# Patient Record
Sex: Female | Born: 2000 | Hispanic: No | Marital: Single | Smoking: Former smoker
Health system: Southern US, Community
[De-identification: ages and names within clinical notes are randomized; demographics above are authoritative.]

## PROBLEM LIST (undated history)

## (undated) DIAGNOSIS — N739 Female pelvic inflammatory disease, unspecified: Secondary | ICD-10-CM

## (undated) DIAGNOSIS — R569 Unspecified convulsions: Secondary | ICD-10-CM

## (undated) DIAGNOSIS — D649 Anemia, unspecified: Secondary | ICD-10-CM

## (undated) HISTORY — PX: MYRINGOTOMY: SHX2060

## (undated) HISTORY — PX: TONSILLECTOMY: SUR1361

## (undated) HISTORY — PX: ROOT CANAL: SHX2363

## (undated) HISTORY — PX: TEAR DUCT PROBING: SHX793

---
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
## 2018-03-31 ENCOUNTER — Emergency Department (HOSPITAL_COMMUNITY): Payer: Self-pay

## 2018-03-31 ENCOUNTER — Other Ambulatory Visit: Payer: Self-pay

## 2018-03-31 ENCOUNTER — Emergency Department (HOSPITAL_COMMUNITY)
Admission: EM | Admit: 2018-03-31 | Discharge: 2018-03-31 | Disposition: A | Discharge status: Home / Self Care | Payer: Self-pay | Attending: Emergency Medicine | Admitting: Emergency Medicine

## 2018-03-31 ENCOUNTER — Encounter (HOSPITAL_COMMUNITY): Payer: Self-pay | Admitting: Emergency Medicine

## 2018-03-31 DIAGNOSIS — R569 Unspecified convulsions: Secondary | ICD-10-CM | POA: Insufficient documentation

## 2018-03-31 DIAGNOSIS — N72 Inflammatory disease of cervix uteri: Secondary | ICD-10-CM | POA: Insufficient documentation

## 2018-03-31 DIAGNOSIS — J45909 Unspecified asthma, uncomplicated: Secondary | ICD-10-CM | POA: Insufficient documentation

## 2018-03-31 DIAGNOSIS — R197 Diarrhea, unspecified: Secondary | ICD-10-CM | POA: Insufficient documentation

## 2018-03-31 DIAGNOSIS — N3000 Acute cystitis without hematuria: Secondary | ICD-10-CM | POA: Insufficient documentation

## 2018-03-31 DIAGNOSIS — R112 Nausea with vomiting, unspecified: Secondary | ICD-10-CM

## 2018-03-31 DIAGNOSIS — Z79899 Other long term (current) drug therapy: Secondary | ICD-10-CM | POA: Insufficient documentation

## 2018-03-31 DIAGNOSIS — Z87891 Personal history of nicotine dependence: Secondary | ICD-10-CM | POA: Insufficient documentation

## 2018-03-31 LAB — URINALYSIS, ROUTINE W REFLEX MICROSCOPIC
Bilirubin Urine: NEGATIVE
Glucose, UA: NEGATIVE mg/dL
Ketones, ur: 20 mg/dL — AB
Nitrite: NEGATIVE
PROTEIN: 30 mg/dL — AB
RBC / HPF: >50 RBC/hpf — ABNORMAL HIGH (ref 0–5)
Specific Gravity, Urine: 1.018 (ref 1.005–1.030)
pH: 6 (ref 5.0–8.0)

## 2018-03-31 LAB — COMPREHENSIVE METABOLIC PANEL
ALK PHOS: 57 U/L (ref 38–126)
ALT: 13 U/L (ref 0–44)
ANION GAP: 8 (ref 5–15)
AST: 18 U/L (ref 15–41)
Albumin: 4.7 g/dL (ref 3.5–5.0)
BUN: 8 mg/dL (ref 6–20)
CO2: 25 mmol/L (ref 22–32)
CREATININE: 0.92 mg/dL (ref 0.44–1.00)
Calcium: 9 mg/dL (ref 8.9–10.3)
Chloride: 106 mmol/L (ref 98–111)
GFR calc non Af Amer: >60 mL/min (ref >60)
Glucose, Bld: 88 mg/dL (ref 70–99)
Potassium: 3.5 mmol/L (ref 3.5–5.1)
Sodium: 139 mmol/L (ref 135–145)
Total Bilirubin: 1.1 mg/dL (ref 0.3–1.2)
Total Protein: 7.4 g/dL (ref 6.5–8.1)

## 2018-03-31 LAB — RAPID URINE DRUG SCREEN, HOSP PERFORMED
Amphetamines: NOT DETECTED
Barbiturates: NOT DETECTED
Benzodiazepines: NOT DETECTED
Cocaine: POSITIVE — AB
OPIATES: NOT DETECTED
Tetrahydrocannabinol: POSITIVE — AB

## 2018-03-31 LAB — LIPASE, BLOOD: Lipase: 19 U/L (ref 11–51)

## 2018-03-31 LAB — CBC WITH DIFFERENTIAL/PLATELET
ABS IMMATURE GRANULOCYTES: 0.04 10*3/uL (ref 0.00–0.07)
Basophils Absolute: 0.1 10*3/uL (ref 0.0–0.1)
Basophils Relative: 1 %
EOS PCT: 1 %
Eosinophils Absolute: 0.2 10*3/uL (ref 0.0–0.5)
HEMATOCRIT: 44.6 % (ref 36.0–46.0)
HEMOGLOBIN: 14.5 g/dL (ref 12.0–15.0)
Immature Granulocytes: 0 %
LYMPHS PCT: 12 %
Lymphs Abs: 2.2 10*3/uL (ref 0.7–4.0)
MCH: 32.3 pg (ref 26.0–34.0)
MCHC: 32.5 g/dL (ref 30.0–36.0)
MCV: 99.3 fL (ref 80.0–100.0)
MONO ABS: 1 10*3/uL (ref 0.1–1.0)
MONOS PCT: 5 %
NEUTROS ABS: 15.2 10*3/uL — AB (ref 1.7–7.7)
Neutrophils Relative %: 81 %
Platelets: 277 10*3/uL (ref 150–400)
RBC: 4.49 MIL/uL (ref 3.87–5.11)
RDW: 11.9 % (ref 11.5–15.5)
WBC: 18.7 10*3/uL — AB (ref 4.0–10.5)
nRBC: 0 % (ref 0.0–0.2)

## 2018-03-31 LAB — WET PREP, GENITAL
Clue Cells Wet Prep HPF POC: NONE SEEN
Sperm: NONE SEEN
Trich, Wet Prep: NONE SEEN
Yeast Wet Prep HPF POC: NONE SEEN

## 2018-03-31 LAB — I-STAT BETA HCG BLOOD, ED (MC, WL, AP ONLY): I-stat hCG, quantitative: <5 m[IU]/mL (ref <5)

## 2018-03-31 MED ORDER — SULFAMETHOXAZOLE-TRIMETHOPRIM 800-160 MG PO TABS: 1.0000 | ORAL_TABLET | Freq: Two times a day (BID) | ORAL | 0 refills | Status: AC

## 2018-03-31 MED ORDER — DICYCLOMINE HCL 20 MG PO TABS: 20.0000 mg | ORAL_TABLET | Freq: Two times a day (BID) | ORAL | 0 refills | Status: DC | PRN

## 2018-03-31 MED ORDER — ONDANSETRON HCL 4 MG PO TABS: 4.0000 mg | ORAL_TABLET | Freq: Three times a day (TID) | ORAL | 0 refills | Status: DC | PRN

## 2018-03-31 MED ORDER — MORPHINE SULFATE (PF) 4 MG/ML IV SOLN
4.0000 mg | Freq: Once | INTRAVENOUS | Status: AC
  Administered 2018-03-31: 4 mg via INTRAVENOUS
  Filled 2018-03-31: qty 1

## 2018-03-31 MED ORDER — SODIUM CHLORIDE 0.9 % IV BOLUS
1000.0000 mL | Freq: Once | INTRAVENOUS | Status: AC
  Administered 2018-03-31: 1000 mL via INTRAVENOUS

## 2018-03-31 MED ORDER — SODIUM CHLORIDE 0.9 % IV SOLN
1.0000 g | Freq: Once | INTRAVENOUS | Status: AC
  Administered 2018-03-31: 1 g via INTRAVENOUS
  Filled 2018-03-31: qty 10

## 2018-03-31 MED ORDER — DOXYCYCLINE HYCLATE 100 MG PO CAPS: 100.0000 mg | ORAL_CAPSULE | Freq: Two times a day (BID) | ORAL | 0 refills | Status: AC

## 2018-03-31 MED ORDER — DICYCLOMINE HCL 10 MG PO CAPS
10.0000 mg | ORAL_CAPSULE | Freq: Once | ORAL | Status: AC
  Administered 2018-03-31: 10 mg via ORAL
  Filled 2018-03-31: qty 1

## 2018-03-31 MED ORDER — METOCLOPRAMIDE HCL 5 MG/ML IJ SOLN
5.0000 mg | Freq: Once | INTRAMUSCULAR | Status: AC
  Administered 2018-03-31: 5 mg via INTRAVENOUS
  Filled 2018-03-31: qty 2

## 2018-03-31 NOTE — ED Notes (Signed)
Pt returned from ultrasound

## 2018-03-31 NOTE — Discharge Instructions (Signed)
Take antibiotics as prescribed.  Take the entire course, even if your symptoms improve.  There are 2 different antibiotics, and the are both important for treating different things. Use Zofran as needed for nausea or vomiting. Use Bentyl as needed for abdominal pain. Stop using cocaine and marijuana, this is not helping your symptoms. Make sure you are staying well-hydrated water. If your symptoms not improving, follow-up with either your primary care doctor or your OB/GYN for further evaluation. Return to the emergency room if you develop high fevers, persistent vomiting, severe worsening abdominal pain, any new, worsening, or concerning symptoms.

## 2018-03-31 NOTE — ED Triage Notes (Signed)
N/V/D for four days. Denies fever. No cough Started period today, denies chance of pregnancy. 4mg  of zofran with EMS  Patient was explained risks of coming to ED, with possibility of getting COVID patient still wanted to come to ED

## 2018-03-31 NOTE — ED Provider Notes (Signed)
[facility]-EMERGENCY DEPT Provider Note   CSN: [id number] Arrival date & time: 03/31/18  1451    History   Chief Complaint Chief Complaint  Patient presents with   Nausea   Emesis   Diarrhea    HPI Hannah Drake is a 18 y.o. female presenting for evaluation nausea, vomiting, diarrhea, abdominal pain.  Patient states her symptoms began 4 days ago.  Began with nausea, vomiting, diarrhea.  A day or 2 after her symptoms began, she started to develop generalized abdominal pain.  Is constant.  Nothing makes it better or worse.  Yesterday, she started her period.  Patient states this does not normally occur when she has her period, although per chart review this is happened at least once before around her period.  She denies fevers, chills, chest pain, cough, shortness of breath, or abnormal urination.  She has not taken anything for her symptoms including Tylenol or ibuprofen.  Patient report symptoms are worse when she tries to eat, although she is vomiting without attempting p.o.  She denies hematemesis or blood in her stool.  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  Patient states she has no medical problems, takes medications daily.  She denies recent travel or sick contacts.  She denies history of abdominal surgeries.  Patient reports mild vaginal discharge which began several days before her symptoms.  She is sexually active with one female partner who is symptom-free.  She does not always use condoms, is not on birth control.     HPI  Past Medical History:  Diagnosis Date   Asthma    Seizures (HCC)    last seizure at age 37    There are no active problems to display for this patient.   Past Surgical History:  Procedure Laterality Date   MYRINGOTOMY     ROOT CANAL     TEAR DUCT PROBING     TONSILLECTOMY       OB History    Gravida  0   Para  0   Term  0   Preterm  0   AB  0   Living  0     SAB  0   TAB  0   Ectopic  0   Multiple  0   Live Births  0            Home Medications    Prior to Admission medications   Medication Sig Start Date End Date Taking? Authorizing Provider  acetaminophen (TYLENOL) 325 MG tablet Take 650 mg by mouth every 6 (six) hours as needed for mild pain, moderate pain, fever or headache.    Yes [provider]  ibuprofen (ADVIL,MOTRIN) 200 MG tablet Take 200 mg by mouth every 6 (six) hours as needed for moderate pain or cramping.   Yes [provider]  dicyclomine (BENTYL) 20 MG tablet Take 1 tablet (20 mg total) by mouth 2 (two) times daily as needed for spasms. 03/31/18   Suhan Paci, PA-C  doxycycline (VIBRAMYCIN) 100 MG capsule Take 1 capsule (100 mg total) by mouth 2 (two) times daily for 14 days. 03/31/18 04/14/18  Yukio Bisping, PA-C  fluticasone (FLONASE) 50 MCG/ACT nasal spray Place 2 sprays into both nostrils daily. Patient not taking: Reported on 10/14/2017 03/17/15   Ward, Chase Picket, PA-C  ondansetron (ZOFRAN) 4 MG tablet Take 1 tablet (4 mg total) by mouth every 8 (eight) hours as needed. 03/31/18   Jenniffer Vessels,  Adalid Beckmann, PA-C  sulfamethoxazole-trimethoprim (BACTRIM DS,SEPTRA DS) 800-160 MG tablet Take 1 tablet by mouth 2 (two) times daily for 3 days. 03/31/18 04/03/18  Stesha Neyens, Jeanette Caprice, PA-C    Family History Family History  Problem Relation Age of Onset   Cancer Mother    Luiz Blare' disease Mother    Seizures Mother    Asthma Mother     Social History Social History   Tobacco Use   Smoking status: Former Smoker   Smokeless tobacco: Never Used  Substance Use Topics   Alcohol use: No   Drug use: No     Allergies   Patient has no known allergies.   Review of Systems Review of Systems  Gastrointestinal: Positive for abdominal pain, diarrhea, nausea and vomiting.  Genitourinary: Positive for vaginal discharge.  All other systems reviewed and are negative.    Physical Exam Updated Vital  Signs BP 128/69    Pulse 61    Temp 98.6 F (37 C) (Oral)    Resp 18    Ht  (1.575 m)    Wt 56.7 kg    LMP 03/30/2018    SpO2 100%    BMI 22.86 kg/m   Physical Exam Vitals signs and nursing note reviewed. Exam conducted with a chaperone present.  Constitutional:      General: She is not in acute distress.    Appearance: She is well-developed.     Comments: Appears nontoxic  HENT:     Head: Normocephalic and atraumatic.     Mouth/Throat:     Mouth: Mucous membranes are moist.  Eyes:     Conjunctiva/sclera: Conjunctivae normal.     Pupils: Pupils are equal, round, and reactive to light.  Neck:     Musculoskeletal: Normal range of motion and neck supple.  Cardiovascular:     Rate and Rhythm: Normal rate and regular rhythm.     Pulses: Normal pulses.  Pulmonary:     Effort: Pulmonary effort is normal. No respiratory distress.     Breath sounds: Normal breath sounds. No wheezing.  Abdominal:     General: There is no distension.     Palpations: Abdomen is soft. There is no mass.     Tenderness: There is abdominal tenderness. There is no guarding or rebound.     Comments: Generalized abdominal tenderness.  No rigidity, guarding, distention.  Negative rebound.  Genitourinary:    Exam position: Supine.     Cervix: Cervical motion tenderness and cervical bleeding present.     Adnexa:        Right: No fullness.         Left: No fullness.       Comments: Minimal bleeding noted, from the cervix.  Patient reports extreme pain during pelvic exam.  Tender with palpation of the cervix.  No obvious masses or adnexal fullness. Musculoskeletal: Normal range of motion.  Skin:    General: Skin is warm and dry.     Capillary Refill: Capillary refill takes less than 2 seconds.  Neurological:     Mental Status: She is alert and oriented to person, place, and time.      ED Treatments / Results  Labs (all labs ordered are listed, but only abnormal results are displayed) Labs Reviewed    WET PREP, GENITAL - Abnormal; Notable for the following components:      Result Value   WBC, Wet Prep HPF POC FEW (*)    All other components within normal limits  CBC WITH  DIFFERENTIAL/PLATELET - Abnormal; Notable for the following components:   WBC 18.7 (*)    Neutro Abs 15.2 (*)    All other components within normal limits  URINALYSIS, ROUTINE W REFLEX MICROSCOPIC - Abnormal; Notable for the following components:   APPearance HAZY (*)    Hgb urine dipstick LARGE (*)    Ketones, ur 20 (*)    Protein, ur 30 (*)    Leukocytes,Ua SMALL (*)    RBC / HPF >50 (*)    Bacteria, UA MANY (*)    All other components within normal limits  RAPID URINE DRUG SCREEN, HOSP PERFORMED - Abnormal; Notable for the following components:   Cocaine POSITIVE (*)    Tetrahydrocannabinol POSITIVE (*)    All other components within normal limits  URINE CULTURE  COMPREHENSIVE METABOLIC PANEL  LIPASE, BLOOD  RPR  HIV ANTIBODY (ROUTINE TESTING W REFLEX)  I-STAT BETA HCG BLOOD, ED ([facility], [facility], [facility] ONLY)  GC/CHLAMYDIA PROBE AMP ([place]) NOT AT [facility]    EKG None  Radiology US Transvaginal Non-ob  Result Date: 03/31/2018 CLINICAL DATA:  Pain.  Rule out torsion. EXAM: TRANSABDOMINAL AND TRANSVAGINAL ULTRASOUND OF PELVIS DOPPLER ULTRASOUND OF OVARIES TECHNIQUE: Both transabdominal and transvaginal ultrasound examinations of the pelvis were performed. Transabdominal technique was performed for global imaging of the pelvis including uterus, ovaries, adnexal regions, and pelvic cul-de-sac. It was necessary to proceed with endovaginal exam following the transabdominal exam to visualize the endometrium and ovaries. Color and duplex Doppler ultrasound was utilized to evaluate blood flow to the ovaries. COMPARISON:  None. FINDINGS: Uterus Measurements: 8.2 x 3.5 x 3.9 cm = volume: 57.55 mL. No fibroids or other mass visualized. Endometrium Thickness: 2.8 mm.  No focal abnormality visualized. Right ovary Measurements: 3.0  x 2.2 x 2.9 cm = volume: 9.87 mL. Normal appearance/no adnexal mass. Left ovary Measurements: 3.1 x 1.6 x 2.8 cm = volume: 7.2 mL. Normal appearance/no adnexal mass. Pulsed Doppler evaluation of both ovaries demonstrates normal low-resistance arterial and venous waveforms. Other findings A small amount of fluid in the cul-de-sac is likely physiologic. IMPRESSION: 1. The uterus, endometrium, and ovaries are normal in appearance. No evidence of ovarian torsion. A small amount of fluid in the cul-de-sac is likely physiologic. Electronically Signed   By: Gerome Sam III M.D   On: 03/31/2018 19:25   US Pelvis Complete  Result Date: 03/31/2018 CLINICAL DATA:  Pain.  Rule out torsion. EXAM: TRANSABDOMINAL AND TRANSVAGINAL ULTRASOUND OF PELVIS DOPPLER ULTRASOUND OF OVARIES TECHNIQUE: Both transabdominal and transvaginal ultrasound examinations of the pelvis were performed. Transabdominal technique was performed for global imaging of the pelvis including uterus, ovaries, adnexal regions, and pelvic cul-de-sac. It was necessary to proceed with endovaginal exam following the transabdominal exam to visualize the endometrium and ovaries. Color and duplex Doppler ultrasound was utilized to evaluate blood flow to the ovaries. COMPARISON:  None. FINDINGS: Uterus Measurements: 8.2 x 3.5 x 3.9 cm = volume: 57.55 mL. No fibroids or other mass visualized. Endometrium Thickness: 2.8 mm.  No focal abnormality visualized. Right ovary Measurements: 3.0 x 2.2 x 2.9 cm = volume: 9.87 mL. Normal appearance/no adnexal mass. Left ovary Measurements: 3.1 x 1.6 x 2.8 cm = volume: 7.2 mL. Normal appearance/no adnexal mass. Pulsed Doppler evaluation of both ovaries demonstrates normal low-resistance arterial and venous waveforms. Other findings A small amount of fluid in the cul-de-sac is likely physiologic. IMPRESSION: 1. The uterus, endometrium, and ovaries are normal in appearance. No evidence of ovarian torsion. A small amount  of fluid  in the cul-de-sac is likely physiologic. Electronically Signed   By: Gerome Sam III M.D   On: 03/31/2018 19:25   [place] Art/ven Flow Abd Pelv Doppler  Result Date: 03/31/2018 CLINICAL DATA:  Pain.  Rule out torsion. EXAM: TRANSABDOMINAL AND TRANSVAGINAL ULTRASOUND OF PELVIS DOPPLER ULTRASOUND OF OVARIES TECHNIQUE: Both transabdominal and transvaginal ultrasound examinations of the pelvis were performed. Transabdominal technique was performed for global imaging of the pelvis including uterus, ovaries, adnexal regions, and pelvic cul-de-sac. It was necessary to proceed with endovaginal exam following the transabdominal exam to visualize the endometrium and ovaries. Color and duplex Doppler ultrasound was utilized to evaluate blood flow to the ovaries. COMPARISON:  None. FINDINGS: Uterus Measurements: 8.2 x 3.5 x 3.9 cm = volume: 57.55 mL. No fibroids or other mass visualized. Endometrium Thickness: 2.8 mm.  No focal abnormality visualized. Right ovary Measurements: 3.0 x 2.2 x 2.9 cm = volume: 9.87 mL. Normal appearance/no adnexal mass. Left ovary Measurements: 3.1 x 1.6 x 2.8 cm = volume: 7.2 mL. Normal appearance/no adnexal mass. Pulsed Doppler evaluation of both ovaries demonstrates normal low-resistance arterial and venous waveforms. Other findings A small amount of fluid in the cul-de-sac is likely physiologic. IMPRESSION: 1. The uterus, endometrium, and ovaries are normal in appearance. No evidence of ovarian torsion. A small amount of fluid in the cul-de-sac is likely physiologic. Electronically Signed   By: Gerome Sam III M.D   On: 03/31/2018 19:25    Procedures Procedures (including critical care time)  Medications Ordered in ED Medications  sodium chloride 0.9 % bolus 1,000 mL (0 mLs Intravenous Stopped 03/31/18 1653)  dicyclomine (BENTYL) capsule 10 mg (10 mg Oral Given 03/31/18 1607)  metoCLOPramide (REGLAN) injection 5 mg (5 mg Intravenous Given 03/31/18 1547)  morphine 4 MG/ML injection  4 mg (4 mg Intravenous Given 03/31/18 1607)  cefTRIAXone (ROCEPHIN) 1 g in sodium chloride 0.9 % 100 mL IVPB (0 g Intravenous Stopped 03/31/18 1822)     Initial Impression / Assessment and Plan / ED Course  I have reviewed the triage vital signs and the nursing notes.  Pertinent labs & imaging results that were available during my care of the patient were reviewed by me and considered in my medical decision making (see chart for details).        Patient presenting for evaluation of nausea, vomiting, diarrhea, abdominal pain.  Physical exam as patient is afebrile not tachycardic.  Has been seen in the past for similar symptoms.  Generalized abdominal tenderness, in the setting of nausea, vomiting, diarrhea likely due to viral GI illness.  However, pelvic exam concerning for significant tenderness.  Patient then reports several weeks of gradually increasing pain with intercourse.  Concern for cervicitis.  Also consider TOA, PID, torsion.  As such, will obtain pelvic ultrasound.  Will obtain abdominal labs, urine, and treat symptomatically with Reglan, fluids, and Bentyl.  Labs with leukocytosis at 18, otherwise reassuring.  Kidney, liver, pancreatic function reassuring.  Urine positive for UTI with leuks and many bacteria.  This could be the cause of patient's leukocytosis.  Urine was also positive for cocaine and marijuana, this could be reason patient's white count.  Ultrasound pending.  Ultrasound negative for TOA.  Favor cervicits over PID at this time. Will tx with 2 wks doxy and tx the UTI.  On reassessment, patient reports nausea has completely resolved and her pain is significantly improved.  She is tolerating p.o. without difficulty.  As such, lower suspicion for intra-abdominal infection.  I do not believe CT scan is necessary at this time.  Discussed continued symptomatic treatment at home with careful diet, nausea control, and cessation of drugs.  Discussed importance of taking antibiotics.   Patient to follow-up with PCP and/or OB/GYN as needed if symptoms not improving.  At this time, patient appears safe for discharge.  Return precautions given.  Patient states she understands and agrees to plan.   Final Clinical Impressions(s) / ED Diagnoses   Final diagnoses:  Cervicitis  Acute cystitis without hematuria  Nausea vomiting and diarrhea    ED Discharge Orders         Ordered    ondansetron (ZOFRAN) 4 MG tablet  Every 8 hours PRN     03/31/18 1946    dicyclomine (BENTYL) 20 MG tablet  2 times daily PRN     03/31/18 1946    sulfamethoxazole-trimethoprim (BACTRIM DS,SEPTRA DS) 800-160 MG tablet  2 times daily     03/31/18 1946    doxycycline (VIBRAMYCIN) 100 MG capsule  2 times daily     03/31/18 1946           Alveria Apley, PA-C 03/31/18 1959    Lorre Nick, MD 04/03/18 1406

## 2018-03-31 NOTE — ED Notes (Signed)
Bed: WA06 Expected date:  Expected time:  Means of arrival:  Comments: 18 yo N/V/D, fever, cough

## 2018-03-31 NOTE — ED Notes (Signed)
Pt ultrasound.

## 2018-04-01 LAB — HIV ANTIBODY (ROUTINE TESTING W REFLEX): HIV Screen 4th Generation wRfx: NONREACTIVE

## 2018-04-02 LAB — URINE CULTURE

## 2018-04-02 LAB — RPR: RPR Ser Ql: NONREACTIVE

## 2018-04-02 LAB — GC/CHLAMYDIA PROBE AMP (~~LOC~~) NOT AT ARMC
CHLAMYDIA, DNA PROBE: NEGATIVE
Neisseria Gonorrhea: NEGATIVE

## 2018-05-14 ENCOUNTER — Emergency Department (HOSPITAL_COMMUNITY)
Admission: EM | Admit: 2018-05-14 | Discharge: 2018-05-14 | Disposition: A | Discharge status: Home / Self Care | Payer: Self-pay | Attending: Emergency Medicine | Admitting: Emergency Medicine

## 2018-05-14 ENCOUNTER — Encounter (HOSPITAL_COMMUNITY): Payer: Self-pay

## 2018-05-14 ENCOUNTER — Emergency Department (HOSPITAL_COMMUNITY): Payer: Self-pay

## 2018-05-14 ENCOUNTER — Other Ambulatory Visit: Payer: Self-pay

## 2018-05-14 DIAGNOSIS — N730 Acute parametritis and pelvic cellulitis: Secondary | ICD-10-CM | POA: Insufficient documentation

## 2018-05-14 DIAGNOSIS — Z87891 Personal history of nicotine dependence: Secondary | ICD-10-CM | POA: Insufficient documentation

## 2018-05-14 DIAGNOSIS — A5901 Trichomonal vulvovaginitis: Secondary | ICD-10-CM | POA: Insufficient documentation

## 2018-05-14 DIAGNOSIS — Z79899 Other long term (current) drug therapy: Secondary | ICD-10-CM | POA: Insufficient documentation

## 2018-05-14 DIAGNOSIS — J45909 Unspecified asthma, uncomplicated: Secondary | ICD-10-CM | POA: Insufficient documentation

## 2018-05-14 DIAGNOSIS — R52 Pain, unspecified: Secondary | ICD-10-CM

## 2018-05-14 LAB — CBC
HCT: 49.4 % — ABNORMAL HIGH (ref 36.0–46.0)
Hemoglobin: 15.6 g/dL — ABNORMAL HIGH (ref 12.0–15.0)
MCH: 31.5 pg (ref 26.0–34.0)
MCHC: 31.6 g/dL (ref 30.0–36.0)
MCV: 99.8 fL (ref 80.0–100.0)
Platelets: 271 10*3/uL (ref 150–400)
RBC: 4.95 MIL/uL (ref 3.87–5.11)
RDW: 11.6 % (ref 11.5–15.5)
WBC: 11.2 10*3/uL — ABNORMAL HIGH (ref 4.0–10.5)
nRBC: 0 % (ref 0.0–0.2)

## 2018-05-14 LAB — URINALYSIS, ROUTINE W REFLEX MICROSCOPIC
Bacteria, UA: NONE SEEN
Bilirubin Urine: NEGATIVE
Glucose, UA: NEGATIVE mg/dL
Hgb urine dipstick: NEGATIVE
Ketones, ur: NEGATIVE mg/dL
Nitrite: NEGATIVE
Protein, ur: NEGATIVE mg/dL
Specific Gravity, Urine: 1.008 (ref 1.005–1.030)
pH: 8 (ref 5.0–8.0)

## 2018-05-14 LAB — WET PREP, GENITAL
Clue Cells Wet Prep HPF POC: NONE SEEN
Sperm: NONE SEEN
WBC, Wet Prep HPF POC: NONE SEEN
Yeast Wet Prep HPF POC: NONE SEEN

## 2018-05-14 LAB — I-STAT BETA HCG BLOOD, ED (MC, WL, AP ONLY): I-stat hCG, quantitative: <5 m[IU]/mL (ref <5)

## 2018-05-14 LAB — PREGNANCY, URINE: Preg Test, Ur: NEGATIVE

## 2018-05-14 MED ORDER — DOXYCYCLINE HYCLATE 100 MG PO TABS
100.0000 mg | ORAL_TABLET | Freq: Once | ORAL | Status: AC
  Administered 2018-05-14: 100 mg via ORAL
  Filled 2018-05-14: qty 1

## 2018-05-14 MED ORDER — STERILE WATER FOR INJECTION IJ SOLN
INTRAMUSCULAR | Status: AC
  Administered 2018-05-14: 0.9 mL
  Filled 2018-05-14: qty 10

## 2018-05-14 MED ORDER — DOXYCYCLINE HYCLATE 100 MG PO CAPS: 100.0000 mg | ORAL_CAPSULE | Freq: Two times a day (BID) | ORAL | 0 refills | Status: AC

## 2018-05-14 MED ORDER — CEFTRIAXONE SODIUM 250 MG IJ SOLR
250.0000 mg | Freq: Once | INTRAMUSCULAR | Status: AC
  Administered 2018-05-14: 20:00:00 250 mg via INTRAMUSCULAR
  Filled 2018-05-14: qty 250

## 2018-05-14 MED ORDER — METRONIDAZOLE 500 MG PO TABS
500.0000 mg | ORAL_TABLET | Freq: Once | ORAL | Status: AC
  Administered 2018-05-14: 500 mg via ORAL
  Filled 2018-05-14: qty 1

## 2018-05-14 MED ORDER — KETOROLAC TROMETHAMINE 15 MG/ML IJ SOLN
15.0000 mg | Freq: Once | INTRAMUSCULAR | Status: DC
  Filled 2018-05-14: qty 1

## 2018-05-14 MED ORDER — KETOROLAC TROMETHAMINE 30 MG/ML IJ SOLN
15.0000 mg | Freq: Once | INTRAMUSCULAR | Status: AC
  Administered 2018-05-14: 20:00:00 15 mg via INTRAMUSCULAR
  Filled 2018-05-14: qty 1

## 2018-05-14 MED ORDER — METRONIDAZOLE 500 MG PO TABS: 500.0000 mg | ORAL_TABLET | Freq: Two times a day (BID) | ORAL | 0 refills | Status: DC

## 2018-05-14 NOTE — ED Triage Notes (Signed)
States since this am white vaginal discharge and states guy she had unprotected sex with now dx with gonorrhea.  No dysuria voiced no fever noted.

## 2018-05-14 NOTE — ED Notes (Signed)
US at bedside

## 2018-05-14 NOTE — ED Provider Notes (Signed)
[facility]-EMERGENCY DEPT Provider Note   CSN: [id number] Arrival date & time: 05/14/18  1514    History   Chief Complaint Chief Complaint  Patient presents with   Vaginal Discharge    HPI Hannah Drake is a 18 y.o. female.     HPI 18 year old female no pertinent past medical history presents to the emergency department today for evaluation of vaginal discharge, lower abdominal pain with nausea and vomiting.  Patient describes the pain as a cramping sensation.  This is intermittent.  Nonradiating.  No history of same.  Does report a white discharge.  Was notified by her sexual partner today that he tested positive for gonorrhea need to come to the ED to get treated.  Patient states that she is sexually active with one female partner does not use protection.  She would like testing for all STDs as she is concerned about this.  Patient denies any diarrhea.  Denies any fevers or chills.  No urinary symptoms.  Has not take anything for symptoms prior to arrival.  No alleviating or aggravating factors.  Patient rates her pain a 8/10. 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   Past Surgical History:  Procedure Laterality Date   MYRINGOTOMY     ROOT CANAL     TEAR DUCT PROBING     TONSILLECTOMY       OB History    Gravida  0   Para  0   Term  0   Preterm  0   AB  0   Living  0     SAB  0   TAB  0   Ectopic  0   Multiple  0   Live Births  0            Home Medications    Prior to Admission medications   Medication Sig Start Date End Date Taking? Authorizing Provider  acetaminophen (TYLENOL) 325 MG tablet Take 650 mg by mouth every 6 (six) hours as needed for mild pain, moderate pain, fever or headache.     [provider]  dicyclomine (BENTYL) 20 MG tablet Take 1 tablet (20 mg total) by mouth 2 (two)  times daily as needed for spasms. 03/31/18   Caccavale, Sophia, PA-C  doxycycline (VIBRAMYCIN) 100 MG capsule Take 1 capsule (100 mg total) by mouth 2 (two) times daily for 14 days. 05/14/18 05/28/18  Rise Mu, PA-C  fluticasone (FLONASE) 50 MCG/ACT nasal spray Place 2 sprays into both nostrils daily. Patient not taking: Reported on 10/14/2017 03/17/15   Ward, Chase Picket, PA-C  ibuprofen (ADVIL,MOTRIN) 200 MG tablet Take 200 mg by mouth every 6 (six) hours as needed for moderate pain or cramping.    [provider]  metroNIDAZOLE (FLAGYL) 500 MG tablet Take 1 tablet (500 mg total) by mouth 2 (two) times daily with a meal. DO NOT CONSUME ALCOHOL WHILE TAKING THIS MEDICATION. 05/14/18   Rise Mu, PA-C  ondansetron (ZOFRAN) 4 MG tablet Take 1 tablet (4 mg total) by mouth every 8 (eight) hours as needed. 03/31/18   Caccavale, Jeanette Caprice, PA-C    Family History Family History  Problem Relation Age of Onset   Cancer Mother    Luiz Blare' disease Mother    Seizures Mother    Asthma Mother  Social History Social History   Tobacco Use   Smoking status: Former Smoker   Smokeless tobacco: Never Used  Substance Use Topics   Alcohol use: No   Drug use: No     Allergies   Patient has no known allergies.   Review of Systems Review of Systems  Constitutional: Negative for chills and fever.  HENT: Negative for congestion.   Eyes: Negative for discharge.  Gastrointestinal: Positive for abdominal pain, nausea and vomiting. Negative for diarrhea.  Genitourinary: Positive for pelvic pain and vaginal discharge. Negative for dysuria, frequency, hematuria and vaginal bleeding.  Musculoskeletal: Negative for myalgias.  Skin: Negative for rash.  Neurological: Negative for headaches.     Physical Exam Updated Vital Signs BP 101/77    Pulse 63    Temp 98.2 F (36.8 C) (Oral)    Resp 17    Ht 5\' 2"  (1.575 m)    Wt 54.4 kg    SpO2 99%    BMI 21.95 kg/m   Physical  Exam Vitals signs and nursing note reviewed.  Constitutional:      General: She is not in acute distress.    Appearance: She is well-developed. She is not toxic-appearing.  HENT:     Head: Normocephalic and atraumatic.     Nose: Nose normal.  Eyes:     General:        Right eye: No discharge.        Left eye: No discharge.     Conjunctiva/sclera: Conjunctivae normal.     Pupils: Pupils are equal, round, and reactive to light.  Neck:     Musculoskeletal: Normal range of motion and neck supple.  Cardiovascular:     Rate and Rhythm: Normal rate and regular rhythm.     Heart sounds: Normal heart sounds.  Pulmonary:     Effort: Pulmonary effort is normal. No respiratory distress.     Breath sounds: Normal breath sounds.  Chest:     Chest wall: No tenderness.  Abdominal:     General: Abdomen is flat. Bowel sounds are normal.     Palpations: Abdomen is soft.     Tenderness: There is abdominal tenderness in the right lower quadrant, suprapubic area and left lower quadrant. There is no right CVA tenderness, left CVA tenderness or rebound. Negative signs include Murphy's sign, Rovsing's sign and McBurney's sign.  Genitourinary:    Comments: Chaperone present for exam. No external lesions, swelling, erythema, or rash of the labia. No erythema,, bleeding, or lesions noted in the vaginal vault.  White discharge noted.  Cervical motion tenderness noted.  No adnexal tenderness, mass or fullness bilaterally. No inguinal adenopathy or hernia.   Musculoskeletal: Normal range of motion.        General: No tenderness.  Lymphadenopathy:     Cervical: No cervical adenopathy.  Skin:    General: Skin is warm and dry.     Capillary Refill: Capillary refill takes less than 2 seconds.  Neurological:     Mental Status: She is alert and oriented to person, place, and time.  Psychiatric:        Behavior: Behavior normal.        Thought Content: Thought content normal.        Judgment: Judgment normal.        ED Treatments / Results  Labs (all labs ordered are listed, but only abnormal results are displayed) Labs Reviewed  WET PREP, GENITAL - Abnormal; Notable for the following components:  Result Value   Trich, Wet Prep PRESENT (*)    All other components within normal limits  URINALYSIS, ROUTINE W REFLEX MICROSCOPIC - Abnormal; Notable for the following components:   Color, Urine STRAW (*)    Leukocytes,Ua SMALL (*)    All other components within normal limits  CBC - Abnormal; Notable for the following components:   WBC 11.2 (*)    Hemoglobin 15.6 (*)    HCT 49.4 (*)    All other components within normal limits  PREGNANCY, URINE  RPR  HIV ANTIBODY (ROUTINE TESTING W REFLEX)  I-STAT BETA HCG BLOOD, ED ([facility], [facility], [facility] ONLY)  GC/CHLAMYDIA PROBE AMP ([place]) NOT AT [facility]    EKG None  Radiology US Pelvis Complete  Result Date: 05/14/2018 CLINICAL DATA:  Pelvic pain for 3-4 days. EXAM: TRANSABDOMINAL AND TRANSVAGINAL ULTRASOUND OF PELVIS DOPPLER ULTRASOUND OF OVARIES TECHNIQUE: Both transabdominal and transvaginal ultrasound examinations of the pelvis were performed. Transabdominal technique was performed for global imaging of the pelvis including uterus, ovaries, adnexal regions, and pelvic cul-de-sac. It was necessary to proceed with endovaginal exam following the transabdominal exam to visualize the ovaries. Color and duplex Doppler ultrasound was utilized to evaluate blood flow to the ovaries. COMPARISON:  Pelvic ultrasound 03/31/2018. FINDINGS: Uterus Measurements: 7.9 x 3.4 x 3.4 cm = volume: 47.8 mL. No fibroids or other mass visualized. Endometrium Thickness: 0.6.  No focal abnormality visualized. Right ovary Measurements: 3.3 x 3.0 x 2.6 cm = volume: 13.1 mL. Normal appearance/no adnexal mass. Left ovary Measurements: 2.8 x 2.8 x 1.6 cm = volume: 6.5 mL. Normal appearance/no adnexal mass. Pulsed Doppler evaluation of both ovaries demonstrates normal low-resistance arterial  and venous waveforms. Other findings No abnormal free fluid. IMPRESSION: Negative for ovarian torsion.  Negative exam. Electronically Signed   By: Drusilla Kanner M.D.   On: 05/14/2018 20:29   [place] Art/ven Flow Abd Pelv Doppler  Result Date: 05/14/2018 CLINICAL DATA:  Pelvic pain for 3-4 days. EXAM: TRANSABDOMINAL AND TRANSVAGINAL ULTRASOUND OF PELVIS DOPPLER ULTRASOUND OF OVARIES TECHNIQUE: Both transabdominal and transvaginal ultrasound examinations of the pelvis were performed. Transabdominal technique was performed for global imaging of the pelvis including uterus, ovaries, adnexal regions, and pelvic cul-de-sac. It was necessary to proceed with endovaginal exam following the transabdominal exam to visualize the ovaries. Color and duplex Doppler ultrasound was utilized to evaluate blood flow to the ovaries. COMPARISON:  Pelvic ultrasound 03/31/2018. FINDINGS: Uterus Measurements: 7.9 x 3.4 x 3.4 cm = volume: 47.8 mL. No fibroids or other mass visualized. Endometrium Thickness: 0.6.  No focal abnormality visualized. Right ovary Measurements: 3.3 x 3.0 x 2.6 cm = volume: 13.1 mL. Normal appearance/no adnexal mass. Left ovary Measurements: 2.8 x 2.8 x 1.6 cm = volume: 6.5 mL. Normal appearance/no adnexal mass. Pulsed Doppler evaluation of both ovaries demonstrates normal low-resistance arterial and venous waveforms. Other findings No abnormal free fluid. IMPRESSION: Negative for ovarian torsion.  Negative exam. Electronically Signed   By: Drusilla Kanner M.D.   On: 05/14/2018 20:29   US Pelvic Complete W Transvaginal And Torsion R/o  Result Date: 05/14/2018 CLINICAL DATA:  Pelvic pain for 3-4 days. EXAM: TRANSABDOMINAL AND TRANSVAGINAL ULTRASOUND OF PELVIS DOPPLER ULTRASOUND OF OVARIES TECHNIQUE: Both transabdominal and transvaginal ultrasound examinations of the pelvis were performed. Transabdominal technique was performed for global imaging of the pelvis including uterus, ovaries, adnexal regions, and  pelvic cul-de-sac. It was necessary to proceed with endovaginal exam following the transabdominal exam to visualize the ovaries. Color and duplex  Doppler ultrasound was utilized to evaluate blood flow to the ovaries. COMPARISON:  Pelvic ultrasound 03/31/2018. FINDINGS: Uterus Measurements: 7.9 x 3.4 x 3.4 cm = volume: 47.8 mL. No fibroids or other mass visualized. Endometrium Thickness: 0.6.  No focal abnormality visualized. Right ovary Measurements: 3.3 x 3.0 x 2.6 cm = volume: 13.1 mL. Normal appearance/no adnexal mass. Left ovary Measurements: 2.8 x 2.8 x 1.6 cm = volume: 6.5 mL. Normal appearance/no adnexal mass. Pulsed Doppler evaluation of both ovaries demonstrates normal low-resistance arterial and venous waveforms. Other findings No abnormal free fluid. IMPRESSION: Negative for ovarian torsion.  Negative exam. Electronically Signed   By: Drusilla Kannerhomas  Dalessio M.D.   On: 05/14/2018 20:29    Procedures Procedures (including critical care time)  Medications Ordered in ED Medications  doxycycline (VIBRA-TABS) tablet 100 mg (100 mg Oral Given 05/14/18 1958)  cefTRIAXone (ROCEPHIN) injection 250 mg (250 mg Intramuscular Given 05/14/18 2003)  metroNIDAZOLE (FLAGYL) tablet 500 mg (500 mg Oral Given 05/14/18 1958)  sterile water (preservative free) injection (0.9 mLs  Given 05/14/18 2004)  ketorolac (TORADOL) 30 MG/ML injection 15 mg (15 mg Intramuscular Given 05/14/18 2001)     Initial Impression / Assessment and Plan / ED Course  I have reviewed the triage vital signs and the nursing notes.  Pertinent labs & imaging results that were available during my care of the patient were reviewed by me and considered in my medical decision making (see chart for details).        18 year old female presents the ED for evaluation of vaginal discharge and lower abdominal pain in the setting of partner being diagnosed with gonorrhea.  Patient is overall well-appearing and nontoxic.  Vital signs reassuring.  Patient  afebrile.  Bowel sounds present on exam.  Does have lower abdominal pain to palpation but no focal abdominal tenderness.  No signs of peritonitis.  No CVA tenderness.  Pelvic exam documented above.  Patient does have a mild leukocytosis of 11,000 with history of same.  Wet prep is positive for trichomonas.  UA shows no signs of infection.  Urine pregnancy test was negative.  Given cervical motion tenderness concern for PID.  Patient given Rocephin based on 14 days of doxycycline.  Given the positive trichomonas will start 14 days of Flagyl as well.  Ultrasound was obtained that shows no signs of tubo-ovarian abscess or any other pelvic pathology. Doubt appendicitis, diverticulitis, nephrolithiasis, pyelonephritis.  Patient follow-up with OB/GYN in outpatient setting.  Tolerating p.o. fluids any emesis.  Discussed reasons to return to the ED immediately.  Pt is hemodynamically stable, in NAD, & able to ambulate in the ED. Evaluation does not show pathology that would require ongoing emergent intervention or inpatient treatment. I explained the diagnosis to the patient. Pain has been managed & has no complaints prior to dc. Pt is comfortable with above plan and is stable for discharge at this time. All questions were answered prior to disposition. Strict return precautions for f/u to the ED were discussed. Encouraged follow up with PCP.  Final Clinical Impressions(s) / ED Diagnoses   Final diagnoses:  PID (acute pelvic inflammatory disease)  Trichomonas vaginitis    ED Discharge Orders         Ordered    doxycycline (VIBRAMYCIN) 100 MG capsule  2 times daily     05/14/18 2036    metroNIDAZOLE (FLAGYL) 500 MG tablet  2 times daily with meals     05/14/18 2036  Rise Mu, PA-C 05/14/18 2350    Tegeler, Canary Brim, MD 05/15/18 1505

## 2018-05-14 NOTE — Discharge Instructions (Signed)
Your work-up today reveals he had trichomonas.  Have been treated for pelvic laboratory disease likely secondary to your gonorrhea.  You will be notified of your gonorrhea and Chlamydia cultures and neck several days of these are positive.  Take antibiotics as prescribed.  Motrin and Tylenol for pain.  Make sure you follow-up with your primary care doctor OB/GYN doctor.  If you develop any fevers return to the ED.  Sustained from sexual contact for the next 14 days.

## 2018-05-15 LAB — GC/CHLAMYDIA PROBE AMP (~~LOC~~) NOT AT ARMC
Chlamydia: NEGATIVE
Neisseria Gonorrhea: POSITIVE — AB

## 2018-05-15 LAB — HIV ANTIBODY (ROUTINE TESTING W REFLEX): HIV Screen 4th Generation wRfx: NONREACTIVE

## 2018-05-23 LAB — RPR, QUANT+TP ABS (REFLEX)
Rapid Plasma Reagin, Quant: 1:1 {titer} — ABNORMAL HIGH
T Pallidum Abs: NONREACTIVE

## 2018-05-23 LAB — RPR: RPR Ser Ql: REACTIVE — AB

## 2018-07-16 ENCOUNTER — Emergency Department (HOSPITAL_COMMUNITY): Payer: Self-pay

## 2018-07-16 ENCOUNTER — Other Ambulatory Visit: Payer: Self-pay

## 2018-07-16 ENCOUNTER — Emergency Department (HOSPITAL_COMMUNITY)
Admission: EM | Admit: 2018-07-16 | Discharge: 2018-07-16 | Disposition: A | Discharge status: Home / Self Care | Payer: Self-pay | Attending: Emergency Medicine | Admitting: Emergency Medicine

## 2018-07-16 ENCOUNTER — Encounter (HOSPITAL_COMMUNITY): Payer: Self-pay | Admitting: Emergency Medicine

## 2018-07-16 DIAGNOSIS — N760 Acute vaginitis: Secondary | ICD-10-CM | POA: Insufficient documentation

## 2018-07-16 DIAGNOSIS — N730 Acute parametritis and pelvic cellulitis: Secondary | ICD-10-CM

## 2018-07-16 DIAGNOSIS — Z113 Encounter for screening for infections with a predominantly sexual mode of transmission: Secondary | ICD-10-CM | POA: Insufficient documentation

## 2018-07-16 DIAGNOSIS — Z711 Person with feared health complaint in whom no diagnosis is made: Secondary | ICD-10-CM

## 2018-07-16 DIAGNOSIS — N83201 Unspecified ovarian cyst, right side: Secondary | ICD-10-CM | POA: Insufficient documentation

## 2018-07-16 DIAGNOSIS — R52 Pain, unspecified: Secondary | ICD-10-CM

## 2018-07-16 DIAGNOSIS — N739 Female pelvic inflammatory disease, unspecified: Secondary | ICD-10-CM | POA: Insufficient documentation

## 2018-07-16 DIAGNOSIS — B9689 Other specified bacterial agents as the cause of diseases classified elsewhere: Secondary | ICD-10-CM

## 2018-07-16 LAB — URINALYSIS, ROUTINE W REFLEX MICROSCOPIC
Bilirubin Urine: NEGATIVE
Glucose, UA: NEGATIVE mg/dL
Hgb urine dipstick: NEGATIVE
Ketones, ur: NEGATIVE mg/dL
Leukocytes,Ua: NEGATIVE
Nitrite: NEGATIVE
Protein, ur: NEGATIVE mg/dL
Specific Gravity, Urine: 1.006 (ref 1.005–1.030)
pH: 8 (ref 5.0–8.0)

## 2018-07-16 LAB — WET PREP, GENITAL
Sperm: NONE SEEN
Trich, Wet Prep: NONE SEEN
WBC, Wet Prep HPF POC: NONE SEEN
Yeast Wet Prep HPF POC: NONE SEEN

## 2018-07-16 LAB — PREGNANCY, URINE: Preg Test, Ur: NEGATIVE

## 2018-07-16 MED ORDER — DOXYCYCLINE HYCLATE 100 MG PO CAPS: 100.0000 mg | ORAL_CAPSULE | Freq: Two times a day (BID) | ORAL | 0 refills | Status: AC

## 2018-07-16 MED ORDER — DOXYCYCLINE HYCLATE 100 MG PO TABS
100.0000 mg | ORAL_TABLET | Freq: Once | ORAL | Status: AC
  Administered 2018-07-16: 100 mg via ORAL
  Filled 2018-07-16: qty 1

## 2018-07-16 MED ORDER — METRONIDAZOLE 500 MG PO TABS
500.0000 mg | ORAL_TABLET | Freq: Once | ORAL | Status: AC
  Administered 2018-07-16: 500 mg via ORAL
  Filled 2018-07-16: qty 1

## 2018-07-16 MED ORDER — METRONIDAZOLE 500 MG PO TABS: 500.0000 mg | ORAL_TABLET | Freq: Two times a day (BID) | ORAL | 0 refills | Status: DC

## 2018-07-16 MED ORDER — CEFTRIAXONE SODIUM 250 MG IJ SOLR
250.0000 mg | Freq: Once | INTRAMUSCULAR | Status: AC
  Administered 2018-07-16: 250 mg via INTRAMUSCULAR
  Filled 2018-07-16: qty 250

## 2018-07-16 NOTE — ED Triage Notes (Signed)
Pt complaint of vaginal discomfort/discharge/odor for 2 days; concern for STD.

## 2018-07-16 NOTE — Discharge Instructions (Addendum)
It is important for you to take the antibiotics regardless of symptom improvement to prevent worsening or recurrence of your infection. Follow-up with the results of your remaining lab work when it is available. Return to ED if you start to have worsening symptoms, worsening abdominal pain or develop a fever, lightheadedness or loss of consciousness.

## 2018-07-16 NOTE — ED Provider Notes (Signed)
Care assumed from [facility], [place], at shift change, please see their notes for full documentation of patient's complaint/HPI. Briefly, pt here with vaginal discharge, dysuria, and lower abdominal pain. Results so far show bacterial vaginosis; no UTI; concern for PID with CMT on pelvic exam. Awaiting pelvic ultrasound to rule out TOA. Plan is to discharge home if no findings on ultrasound; meds have been sent to pharmacy.    Physical Exam  BP (!) 107/56 (BP Location: Right Arm)   Pulse 60   Temp 98.5 F (36.9 C) (Oral)   Resp 17   LMP 07/10/2018   SpO2 100%   Physical Exam  ED Course/Procedures     Procedures  MDM  Ultrasound negative for TOA or torsion. Does show 1.9 cm ovarian cyst, compared to previous mildly enlarged. Will have patient follow up with OBGYN; resource given. Advised patient to take antibiotics as prescribed and to finish full course. She is to refrain from intercourse for the next 10 days and follow up on her GC/chlamydia results. Advised to refrain from EtOH as well to avoid disulfiram reaction. She is in agreement  With plan at this time and stable for discharge home.        Eustaquio Maize, PA-C 07/16/18 2358    Tegeler, Gwenyth Allegra, MD 07/17/18 [phone]

## 2018-07-16 NOTE — ED Provider Notes (Signed)
[facility] DEPT Provider Note   CSN: [id number] Arrival date & time: 07/16/18  1128    History   Chief Complaint Chief Complaint  Patient presents with  . Vaginal Discharge    HPI Hannah Drake is a 18 y.o. female with a past medical history of asthma, who presents to ED for 2-day history of vaginal discharge, odor, slight dysuria and lower abdominal discomfort.  She had unprotected sexual intercourse about 1 week ago and is concerned that she has an STD.  She reports history of STD in the past and states that this feels similar.  She denies any fever, back pain, abnormal bleeding, vomiting or bowel changes.     HPI  Past Medical History:  Diagnosis Date  . Asthma   . Seizures ([place])    last seizure at age 49    There are no active problems to display for this patient.   Past Surgical History:  Procedure Laterality Date  . MYRINGOTOMY    . ROOT CANAL    . TEAR DUCT PROBING    . TONSILLECTOMY       OB History    Gravida  0   Para  0   Term  0   Preterm  0   AB  0   Living  0     SAB  0   TAB  0   Ectopic  0   Multiple  0   Live Births  0            Home Medications    Prior to Admission medications   Medication Sig Start Date End Date Taking? Authorizing Provider  acetaminophen (TYLENOL) 325 MG tablet Take 650 mg by mouth every 6 (six) hours as needed for mild pain, moderate pain, fever or headache.    Yes [provider]  ibuprofen (ADVIL,MOTRIN) 200 MG tablet Take 200 mg by mouth every 6 (six) hours as needed for moderate pain or cramping.   Yes [provider]  dicyclomine (BENTYL) 20 MG tablet Take 1 tablet (20 mg total) by mouth 2 (two) times daily as needed for spasms. Patient not taking: Reported on 07/16/2018 03/31/18   Caccavale, Sophia, PA-C  doxycycline (VIBRAMYCIN) 100 MG capsule Take 1 capsule (100 mg total) by mouth 2 (two) times daily for 14 days. 07/16/18 07/30/18  Cheri Ayotte,  PA-C  fluticasone (FLONASE) 50 MCG/ACT nasal spray Place 2 sprays into both nostrils daily. Patient not taking: Reported on 10/14/2017 03/17/15   Ward, Ozella Almond, PA-C  metroNIDAZOLE (FLAGYL) 500 MG tablet Take 1 tablet (500 mg total) by mouth 2 (two) times daily. 07/16/18   Saahas Hidrogo, PA-C  ondansetron (ZOFRAN) 4 MG tablet Take 1 tablet (4 mg total) by mouth every 8 (eight) hours as needed. Patient not taking: Reported on 07/16/2018 03/31/18   Franchot Heidelberg, PA-C    Family History Family History  Problem Relation Age of Onset  . Cancer Mother   . Graves' disease Mother   . Seizures Mother   . Asthma Mother     Social History Social History   Tobacco Use  . Smoking status: Former Research scientist (life sciences)  . Smokeless tobacco: Never Used  Substance Use Topics  . Alcohol use: No  . Drug use: No     Allergies   Patient has no known allergies.   Review of Systems Review of Systems  Constitutional: Negative for appetite change, chills and fever.  HENT: Negative for ear pain, rhinorrhea, sneezing  and sore throat.   Eyes: Negative for photophobia and visual disturbance.  Respiratory: Negative for cough, chest tightness, shortness of breath and wheezing.   Cardiovascular: Negative for chest pain and palpitations.  Gastrointestinal: Negative for abdominal pain, blood in stool, constipation, diarrhea, nausea and vomiting.  Genitourinary: Positive for dysuria and vaginal discharge. Negative for hematuria and urgency.  Musculoskeletal: Negative for myalgias.  Skin: Negative for rash.  Neurological: Negative for dizziness, weakness and light-headedness.     Physical Exam Updated Vital Signs BP (!) 107/56 (BP Location: Right Arm)   Pulse 60   Temp 98.5 F (36.9 C) (Oral)   Resp 17   LMP 07/10/2018   SpO2 100%   Physical Exam Vitals signs and nursing note reviewed. Exam conducted with a chaperone present.  Constitutional:      General: She is not in acute distress.    Appearance:  She is well-developed.  HENT:     Head: Normocephalic and atraumatic.     Nose: Nose normal.  Eyes:     General: No scleral icterus.       Right eye: No discharge.        Left eye: No discharge.     Conjunctiva/sclera: Conjunctivae normal.  Neck:     Musculoskeletal: Normal range of motion and neck supple.  Cardiovascular:     Rate and Rhythm: Normal rate and regular rhythm.     Heart sounds: Normal heart sounds. No murmur. No friction rub. No gallop.   Pulmonary:     Effort: Pulmonary effort is normal. No respiratory distress.     Breath sounds: Normal breath sounds.  Abdominal:     General: Bowel sounds are normal. There is no distension.     Palpations: Abdomen is soft.     Tenderness: There is no abdominal tenderness. There is no guarding.  Genitourinary:    Vagina: Vaginal discharge present. No bleeding.     Cervix: No cervical motion tenderness or cervical bleeding.     Uterus: Tender.      Adnexa:        Left: Tenderness present.   Musculoskeletal: Normal range of motion.  Skin:    General: Skin is warm and dry.     Findings: No rash.  Neurological:     Mental Status: She is alert.     Motor: No abnormal muscle tone.     Coordination: Coordination normal.      ED Treatments / Results  Labs (all labs ordered are listed, but only abnormal results are displayed) Labs Reviewed  WET PREP, GENITAL - Abnormal; Notable for the following components:      Result Value   Clue Cells Wet Prep HPF POC PRESENT (*)    All other components within normal limits  URINALYSIS, ROUTINE W REFLEX MICROSCOPIC - Abnormal; Notable for the following components:   Color, Urine STRAW (*)    All other components within normal limits  PREGNANCY, URINE  GC/CHLAMYDIA PROBE AMP [facility]    EKG None  Radiology No results found.  Procedures Procedures (including critical care time)  Medications Ordered in ED Medications  cefTRIAXone (ROCEPHIN) injection 250 mg (has  no administration in time range)  doxycycline (VIBRA-TABS) tablet 100 mg (has no administration in time range)  metroNIDAZOLE (FLAGYL) tablet 500 mg (has no administration in time range)     Initial Impression / Assessment and Plan / ED Course  I have reviewed the triage vital signs and the nursing notes.  Pertinent labs & imaging results that were available during my care of the patient were reviewed by me and considered in my medical decision making (see chart for details).        18 year old female with a past medical history of PID presents to ED for vaginal discharge, discomfort and concern for STDs.  She admits to unprotected sexual intercourse 1 week ago.  She is unsure if this feels similar to the time she was diagnosed with PID. Denies fever, back pain. Vital signs are within normal limits.  Pelvic exam revealed some left adnexal tenderness and uterine tenderness but no cervical motion tenderness.  Wet prep shows clue cells.  GC chlamydia probe pending.  Pregnancy test is negative.  Urinalysis is negative for UTI.  Due to her history of PID, will treat for same but will need u/s to evaluate for TOA or other emergent or surgical cause of her left adnexal tenderness and uterine tenderness. Care handed off to [facility] who will f/u with results. Anticipate discharge home if ultrasound is negative.  Final Clinical Impressions(s) / ED Diagnoses   Final diagnoses:  BV (bacterial vaginosis)  Concern about STD in female without diagnosis  PID (acute pelvic inflammatory disease)    ED Discharge Orders         Ordered    metroNIDAZOLE (FLAGYL) 500 MG tablet  2 times daily     07/16/18 1704    doxycycline (VIBRAMYCIN) 100 MG capsule  2 times daily     07/16/18 1704          Portions of this note were generated with Dragon dictation software. Dictation errors may occur despite best attempts at proofreading.    Dietrich PatesKhatri, Zoella Roberti, PA-C 07/16/18 1711    Gerhard MunchLockwood, Robert, MD 07/17/18  1515

## 2018-07-17 LAB — GC/CHLAMYDIA PROBE AMP (~~LOC~~) NOT AT ARMC
Chlamydia: NEGATIVE
Neisseria Gonorrhea: NEGATIVE

## 2019-04-04 ENCOUNTER — Emergency Department (HOSPITAL_COMMUNITY): Payer: Self-pay

## 2019-04-04 ENCOUNTER — Emergency Department (HOSPITAL_COMMUNITY)
Admission: EM | Admit: 2019-04-04 | Discharge: 2019-04-04 | Disposition: A | Discharge status: Home / Self Care | Payer: Self-pay | Attending: Emergency Medicine | Admitting: Emergency Medicine

## 2019-04-04 ENCOUNTER — Encounter (HOSPITAL_COMMUNITY): Payer: Self-pay | Admitting: Emergency Medicine

## 2019-04-04 ENCOUNTER — Other Ambulatory Visit: Payer: Self-pay

## 2019-04-04 DIAGNOSIS — R197 Diarrhea, unspecified: Secondary | ICD-10-CM | POA: Insufficient documentation

## 2019-04-04 DIAGNOSIS — R1084 Generalized abdominal pain: Secondary | ICD-10-CM | POA: Insufficient documentation

## 2019-04-04 DIAGNOSIS — J45909 Unspecified asthma, uncomplicated: Secondary | ICD-10-CM | POA: Insufficient documentation

## 2019-04-04 DIAGNOSIS — R112 Nausea with vomiting, unspecified: Secondary | ICD-10-CM | POA: Insufficient documentation

## 2019-04-04 DIAGNOSIS — Z87891 Personal history of nicotine dependence: Secondary | ICD-10-CM | POA: Insufficient documentation

## 2019-04-04 HISTORY — DX: Female pelvic inflammatory disease, unspecified: N73.9

## 2019-04-04 LAB — I-STAT BETA HCG BLOOD, ED (MC, WL, AP ONLY): I-stat hCG, quantitative: <5 m[IU]/mL (ref <5)

## 2019-04-04 LAB — COMPREHENSIVE METABOLIC PANEL
ALT: 12 U/L (ref 0–44)
AST: 16 U/L (ref 15–41)
Albumin: 4.8 g/dL (ref 3.5–5.0)
Alkaline Phosphatase: 50 U/L (ref 38–126)
Anion gap: 5 (ref 5–15)
BUN: 8 mg/dL (ref 6–20)
CO2: 23 mmol/L (ref 22–32)
Calcium: 9.6 mg/dL (ref 8.9–10.3)
Chloride: 111 mmol/L (ref 98–111)
Creatinine, Ser: 0.87 mg/dL (ref 0.44–1.00)
GFR calc Af Amer: >60 mL/min (ref >60)
GFR calc non Af Amer: >60 mL/min (ref >60)
Glucose, Bld: 91 mg/dL (ref 70–99)
Potassium: 3.6 mmol/L (ref 3.5–5.1)
Sodium: 139 mmol/L (ref 135–145)
Total Bilirubin: 0.7 mg/dL (ref 0.3–1.2)
Total Protein: 7.4 g/dL (ref 6.5–8.1)

## 2019-04-04 LAB — CBC WITH DIFFERENTIAL/PLATELET
Abs Immature Granulocytes: 0.02 10*3/uL (ref 0.00–0.07)
Basophils Absolute: 0.1 10*3/uL (ref 0.0–0.1)
Basophils Relative: 1 %
Eosinophils Absolute: 0.5 10*3/uL (ref 0.0–0.5)
Eosinophils Relative: 6 %
HCT: 45.1 % (ref 36.0–46.0)
Hemoglobin: 14.9 g/dL (ref 12.0–15.0)
Immature Granulocytes: 0 %
Lymphocytes Relative: 32 %
Lymphs Abs: 3 10*3/uL (ref 0.7–4.0)
MCH: 31.9 pg (ref 26.0–34.0)
MCHC: 33 g/dL (ref 30.0–36.0)
MCV: 96.6 fL (ref 80.0–100.0)
Monocytes Absolute: 0.8 10*3/uL (ref 0.1–1.0)
Monocytes Relative: 9 %
Neutro Abs: 4.9 10*3/uL (ref 1.7–7.7)
Neutrophils Relative %: 52 %
Platelets: 281 10*3/uL (ref 150–400)
RBC: 4.67 MIL/uL (ref 3.87–5.11)
RDW: 11.4 % — ABNORMAL LOW (ref 11.5–15.5)
WBC: 9.3 10*3/uL (ref 4.0–10.5)
nRBC: 0 % (ref 0.0–0.2)

## 2019-04-04 LAB — URINALYSIS, ROUTINE W REFLEX MICROSCOPIC
Bilirubin Urine: NEGATIVE
Glucose, UA: NEGATIVE mg/dL
Ketones, ur: 5 mg/dL — AB
Leukocytes,Ua: NEGATIVE
Nitrite: NEGATIVE
Protein, ur: 30 mg/dL — AB
Specific Gravity, Urine: 1.012 (ref 1.005–1.030)
pH: 7 (ref 5.0–8.0)

## 2019-04-04 LAB — PREGNANCY, URINE: Preg Test, Ur: NEGATIVE

## 2019-04-04 LAB — LIPASE, BLOOD: Lipase: 19 U/L (ref 11–51)

## 2019-04-04 MED ORDER — ONDANSETRON HCL 4 MG/2ML IJ SOLN
4.0000 mg | Freq: Once | INTRAMUSCULAR | Status: AC
  Administered 2019-04-04: 15:00:00 4 mg via INTRAVENOUS
  Filled 2019-04-04: qty 2

## 2019-04-04 MED ORDER — HYDROMORPHONE HCL 1 MG/ML IJ SOLN
1.0000 mg | Freq: Once | INTRAMUSCULAR | Status: AC
  Administered 2019-04-04: 1 mg via INTRAVENOUS
  Filled 2019-04-04: qty 1

## 2019-04-04 MED ORDER — SODIUM CHLORIDE 0.9 % IV BOLUS
1000.0000 mL | Freq: Once | INTRAVENOUS | Status: AC
  Administered 2019-04-04: 1000 mL via INTRAVENOUS

## 2019-04-04 MED ORDER — MORPHINE SULFATE (PF) 4 MG/ML IV SOLN
4.0000 mg | Freq: Once | INTRAVENOUS | Status: AC
  Administered 2019-04-04: 4 mg via INTRAVENOUS
  Filled 2019-04-04: qty 1

## 2019-04-04 MED ORDER — DICYCLOMINE HCL 10 MG PO CAPS: 20.0000 mg | ORAL_CAPSULE | Freq: Four times a day (QID) | ORAL | 0 refills | Status: AC | PRN

## 2019-04-04 MED ORDER — PROMETHAZINE HCL 25 MG/ML IJ SOLN
25.0000 mg | Freq: Once | INTRAMUSCULAR | Status: AC
  Administered 2019-04-04: 15:00:00 25 mg via INTRAVENOUS
  Filled 2019-04-04: qty 1

## 2019-04-04 MED ORDER — METOCLOPRAMIDE HCL 5 MG/ML IJ SOLN
10.0000 mg | Freq: Once | INTRAMUSCULAR | Status: AC
  Administered 2019-04-04: 10 mg via INTRAVENOUS
  Filled 2019-04-04: qty 2

## 2019-04-04 MED ORDER — IOHEXOL 300 MG/ML  SOLN
100.0000 mL | Freq: Once | INTRAMUSCULAR | Status: AC | PRN
  Administered 2019-04-04: 100 mL via INTRAVENOUS

## 2019-04-04 MED ORDER — PROMETHAZINE HCL 25 MG PO TABS: 25.0000 mg | ORAL_TABLET | Freq: Four times a day (QID) | ORAL | 0 refills | Status: AC | PRN

## 2019-04-04 NOTE — ED Provider Notes (Signed)
PROGRESS NOTE                                                                                                                 This is a sign-out from PA Lawyer at shift change: Hannah Drake is a 19 y.o. female presenting with lower abdominal pain and diarrhea.  Plan is to follow-up CT and discharged home if no acute findings.  Please refer to previous note for full HPI, ROS, PMH and PE.   CT with trace free fluid likely physiologic, no other acute findings.  Discussed with patient repeat abdominal exam benign, amenable to discharge home, return precautions discussed.     Kaylyn Lim 04/11/19 1706    Pricilla Loveless, MD 04/15/19 1659

## 2019-04-04 NOTE — ED Notes (Signed)
Call made to CT about uploading report.

## 2019-04-04 NOTE — ED Notes (Signed)
Pt was given a sprite for fluid challenge. Pt was able to swallow with no complications.

## 2019-04-04 NOTE — ED Notes (Signed)
Patient verbalizes understanding of discharge instructions, prescription medications, and follow up care. Opportunity for questioning and answers were provided. All questions answered completely. PIV removed, catheter intact. Site dressed with gauze and tape. Armband removed by staff, pt discharged from ED. Ambulatory from ED with strong, steady gait 

## 2019-04-04 NOTE — ED Notes (Signed)
Pt back from CT

## 2019-04-04 NOTE — ED Triage Notes (Signed)
Pt. Presents from home via EMS, awake and alert x 4, c/o Lower abdominal pain associated with N/V/D that started today morning. Patient also c/o Pelvic pain with Hx PIV three months ago. Denies fever or chills.

## 2019-04-04 NOTE — ED Provider Notes (Signed)
[facility] EMERGENCY DEPARTMENT Provider Note   CSN: [id number] Arrival date & time:        History Chief Complaint  Patient presents with  . Abdominal Pain  . Nausea  . Vomiting  . Diarrhea    Hannah Drake is a 19 y.o. female.  HPI Patient presents to the emergency department with vomiting abdominal pain that started last night.  The patient states that she states she is also had some diarrhea as well.  Patient states that she has had no fever or chills.  She states that the pain is in the mid abdomen.  She states that she did not take any medications prior to arrival of her symptoms.  The patient denies chest pain, shortness of breath, headache,blurred vision, neck pain, fever, cough, weakness, numbness, dizziness, anorexia, edema,  rash, back pain, dysuria, hematemesis, bloody stool, near syncope, or syncope.  Past Medical History:  Diagnosis Date  . Asthma   . Pelvic inflammatory disease (PID) 01/0/2021  . Seizures (HCC)    last seizure at age 21    There are no problems to display for this patient.   Past Surgical History:  Procedure Laterality Date  . MYRINGOTOMY    . ROOT CANAL    . TEAR DUCT PROBING    . TONSILLECTOMY       OB History    Gravida  0   Para  0   Term  0   Preterm  0   AB  0   Living  0     SAB  0   TAB  0   Ectopic  0   Multiple  0   Live Births  0           Family History  Problem Relation Age of Onset  . Cancer Mother   . Graves' disease Mother   . Seizures Mother   . Asthma Mother     Social History   Tobacco Use  . Smoking status: Former Games developer  . Smokeless tobacco: Never Used  Substance Use Topics  . Alcohol use: No  . Drug use: No    Home Medications Prior to Admission medications   Medication Sig Start Date End Date Taking? Authorizing Provider  Acetaminophen (MIDOL PO) Take 1 tablet by mouth daily as needed (pain).   Yes [provider]    Allergies    Patient  has no known allergies.  Review of Systems   Review of Systems All other systems negative except as documented in the HPI. All pertinent positives and negatives as reviewed in the HPI. Physical Exam Updated Vital Signs BP (!) 120/58 (BP Location: Left Arm)   Pulse 78   Temp 98.6 F (37 C) (Oral)   Resp 16   Ht 5\' 2"  (1.575 m)   Wt 54.4 kg   LMP 01/04/2019   SpO2 98%   BMI 21.94 kg/m   Physical Exam Vitals and nursing note reviewed.  Constitutional:      General: She is not in acute distress.    Appearance: She is well-developed.  HENT:     Head: Normocephalic and atraumatic.  Eyes:     Pupils: Pupils are equal, round, and reactive to light.  Cardiovascular:     Rate and Rhythm: Normal rate and regular rhythm.     Heart sounds: Normal heart sounds. No murmur. No friction rub. No gallop.   Pulmonary:     Effort: Pulmonary effort is normal. No  respiratory distress.     Breath sounds: Normal breath sounds. No wheezing.  Abdominal:     General: Bowel sounds are normal. There is no distension.     Palpations: Abdomen is soft.     Tenderness: There is generalized abdominal tenderness.  Musculoskeletal:     Cervical back: Normal range of motion and neck supple.  Skin:    General: Skin is warm and dry.     Capillary Refill: Capillary refill takes less than 2 seconds.     Findings: No erythema or rash.  Neurological:     Mental Status: She is alert and oriented to person, place, and time.     Motor: No abnormal muscle tone.     Coordination: Coordination normal.  Psychiatric:        Behavior: Behavior normal.     ED Results / Procedures / Treatments   Labs (all labs ordered are listed, but only abnormal results are displayed) Labs Reviewed  CBC WITH DIFFERENTIAL/PLATELET - Abnormal; Notable for the following components:      Result Value   RDW 11.4 (*)    All other components within normal limits  URINALYSIS, ROUTINE W REFLEX MICROSCOPIC - Abnormal; Notable for the  following components:   APPearance HAZY (*)    Hgb urine dipstick LARGE (*)    Ketones, ur 5 (*)    Protein, ur 30 (*)    Bacteria, UA MANY (*)    All other components within normal limits  COMPREHENSIVE METABOLIC PANEL  LIPASE, BLOOD  PREGNANCY, URINE  I-STAT BETA HCG BLOOD, ED ([facility], [facility], [facility] ONLY)    EKG None  Radiology No results found.  Procedures Procedures (including critical care time)  Medications Ordered in ED Medications  ondansetron (ZOFRAN) injection 4 mg (4 mg Intravenous Given 04/04/19 1432)  sodium chloride 0.9 % bolus 1,000 mL (0 mLs Intravenous Stopped 04/04/19 1534)  promethazine (PHENERGAN) injection 25 mg (25 mg Intravenous Given 04/04/19 1509)  morphine 4 MG/ML injection 4 mg (4 mg Intravenous Given 04/04/19 1509)  sodium chloride 0.9 % bolus 1,000 mL (0 mLs Intravenous Stopped 04/04/19 1700)  iohexol (OMNIPAQUE) 300 MG/ML solution 100 mL (100 mLs Intravenous Contrast Given 04/04/19 1732)    ED Course  I have reviewed the triage vital signs and the nursing notes.  Pertinent labs & imaging results that were available during my care of the patient were reviewed by me and considered in my medical decision making (see chart for details).    MDM Rules/Calculators/A&P                      The patient will be evaluated with laboratory testing along with fluids and antiemetics.  The patient will have CT scan imaging for further evaluation of her abdominal pain. Final Clinical Impression(s) / ED Diagnoses Final diagnoses:  None    Rx / DC Orders ED Discharge Orders    None       Dalia Heading, PA-C 04/04/19 1845    Sherwood Gambler, MD 04/05/19 [phone]

## 2019-04-04 NOTE — Discharge Instructions (Signed)
Push fluids: take small frequent sips of water or Gatorade, do not drink any soda, juice or caffeinated beverages.   ° °Slowly resume solid diet as desired. Avoid food that are spicy, contain dairy and/or have high fat content. ° °Aviod NSAIDs (aspirin, motrin, ibuprofen, naproxen, Aleve et cetera) for pain control because they will irritate your stomach. ° ° °

## 2019-04-04 NOTE — ED Notes (Signed)
meds given per MAR. Name/DOB verified with pt 

## 2019-04-04 NOTE — ED Notes (Signed)
Call placed to imaging for pnd CT scan. Advised patient will soon be transported for scan.

## 2019-04-04 NOTE — ED Notes (Signed)
Pt reporting relief in pain and nausea, tolerating PO

## 2019-05-06 ENCOUNTER — Emergency Department (HOSPITAL_COMMUNITY): Payer: Self-pay

## 2019-05-06 ENCOUNTER — Encounter (HOSPITAL_COMMUNITY): Payer: Self-pay | Admitting: Emergency Medicine

## 2019-05-06 ENCOUNTER — Emergency Department (HOSPITAL_COMMUNITY)
Admission: EM | Admit: 2019-05-06 | Discharge: 2019-05-06 | Disposition: A | Discharge status: Home / Self Care | Payer: Self-pay | Attending: Emergency Medicine | Admitting: Emergency Medicine

## 2019-05-06 ENCOUNTER — Other Ambulatory Visit: Payer: Self-pay

## 2019-05-06 DIAGNOSIS — Z79899 Other long term (current) drug therapy: Secondary | ICD-10-CM | POA: Insufficient documentation

## 2019-05-06 DIAGNOSIS — N730 Acute parametritis and pelvic cellulitis: Secondary | ICD-10-CM | POA: Insufficient documentation

## 2019-05-06 DIAGNOSIS — Z87891 Personal history of nicotine dependence: Secondary | ICD-10-CM | POA: Insufficient documentation

## 2019-05-06 DIAGNOSIS — J45909 Unspecified asthma, uncomplicated: Secondary | ICD-10-CM | POA: Insufficient documentation

## 2019-05-06 DIAGNOSIS — R102 Pelvic and perineal pain: Secondary | ICD-10-CM

## 2019-05-06 LAB — CBC
HCT: 42 % (ref 36.0–46.0)
Hemoglobin: 13.6 g/dL (ref 12.0–15.0)
MCH: 32 pg (ref 26.0–34.0)
MCHC: 32.4 g/dL (ref 30.0–36.0)
MCV: 98.8 fL (ref 80.0–100.0)
Platelets: 269 10*3/uL (ref 150–400)
RBC: 4.25 MIL/uL (ref 3.87–5.11)
RDW: 11.6 % (ref 11.5–15.5)
WBC: 10.9 10*3/uL — ABNORMAL HIGH (ref 4.0–10.5)
nRBC: 0 % (ref 0.0–0.2)

## 2019-05-06 LAB — URINALYSIS, ROUTINE W REFLEX MICROSCOPIC
Bacteria, UA: NONE SEEN
Bilirubin Urine: NEGATIVE
Glucose, UA: NEGATIVE mg/dL
Ketones, ur: NEGATIVE mg/dL
Leukocytes,Ua: NEGATIVE
Nitrite: NEGATIVE
Protein, ur: NEGATIVE mg/dL
Specific Gravity, Urine: 1.015 (ref 1.005–1.030)
pH: 6 (ref 5.0–8.0)

## 2019-05-06 LAB — WET PREP, GENITAL
Clue Cells Wet Prep HPF POC: NONE SEEN
Sperm: NONE SEEN
Trich, Wet Prep: NONE SEEN
Yeast Wet Prep HPF POC: NONE SEEN

## 2019-05-06 LAB — COMPREHENSIVE METABOLIC PANEL
ALT: 13 U/L (ref 0–44)
AST: <5 U/L — ABNORMAL LOW (ref 15–41)
Albumin: 4.3 g/dL (ref 3.5–5.0)
Alkaline Phosphatase: 47 U/L (ref 38–126)
Anion gap: 10 (ref 5–15)
BUN: 8 mg/dL (ref 6–20)
CO2: 27 mmol/L (ref 22–32)
Calcium: 8.8 mg/dL — ABNORMAL LOW (ref 8.9–10.3)
Chloride: 104 mmol/L (ref 98–111)
Creatinine, Ser: 0.84 mg/dL (ref 0.44–1.00)
GFR calc Af Amer: >60 mL/min (ref >60)
GFR calc non Af Amer: >60 mL/min (ref >60)
Glucose, Bld: 101 mg/dL — ABNORMAL HIGH (ref 70–99)
Potassium: 3.7 mmol/L (ref 3.5–5.1)
Sodium: 141 mmol/L (ref 135–145)
Total Bilirubin: 0.4 mg/dL (ref 0.3–1.2)
Total Protein: 6.8 g/dL (ref 6.5–8.1)

## 2019-05-06 LAB — RAPID URINE DRUG SCREEN, HOSP PERFORMED
Amphetamines: NOT DETECTED
Barbiturates: NOT DETECTED
Benzodiazepines: NOT DETECTED
Cocaine: NOT DETECTED
Opiates: NOT DETECTED
Tetrahydrocannabinol: POSITIVE — AB

## 2019-05-06 LAB — GC/CHLAMYDIA PROBE AMP (~~LOC~~) NOT AT ARMC
Chlamydia: NEGATIVE
Comment: NEGATIVE
Comment: NORMAL
Neisseria Gonorrhea: NEGATIVE

## 2019-05-06 LAB — I-STAT BETA HCG BLOOD, ED (MC, WL, AP ONLY): I-stat hCG, quantitative: <5 m[IU]/mL (ref <5)

## 2019-05-06 LAB — LIPASE, BLOOD: Lipase: 22 U/L (ref 11–51)

## 2019-05-06 MED ORDER — IBUPROFEN 600 MG PO TABS: 600.0000 mg | ORAL_TABLET | Freq: Four times a day (QID) | ORAL | 0 refills | Status: AC | PRN

## 2019-05-06 MED ORDER — MORPHINE SULFATE (PF) 4 MG/ML IV SOLN
4.0000 mg | Freq: Once | INTRAVENOUS | Status: AC
  Administered 2019-05-06: 4 mg via INTRAVENOUS
  Filled 2019-05-06: qty 1

## 2019-05-06 MED ORDER — IOHEXOL 300 MG/ML  SOLN
100.0000 mL | Freq: Once | INTRAMUSCULAR | Status: AC | PRN
  Administered 2019-05-06: 100 mL via INTRAVENOUS

## 2019-05-06 MED ORDER — DOXYCYCLINE HYCLATE 100 MG PO CAPS
100.0000 mg | ORAL_CAPSULE | Freq: Two times a day (BID) | ORAL | 0 refills | Status: AC
Start: 2019-05-06 — End: ?

## 2019-05-06 MED ORDER — ONDANSETRON 4 MG PO TBDP: 4.0000 mg | ORAL_TABLET | Freq: Once | ORAL | Status: DC | PRN

## 2019-05-06 MED ORDER — SODIUM CHLORIDE 0.9 % IV BOLUS
1000.0000 mL | Freq: Once | INTRAVENOUS | Status: AC
  Administered 2019-05-06: 1000 mL via INTRAVENOUS

## 2019-05-06 MED ORDER — ONDANSETRON HCL 4 MG/2ML IJ SOLN
4.0000 mg | Freq: Once | INTRAMUSCULAR | Status: AC
  Administered 2019-05-06: 4 mg via INTRAVENOUS
  Filled 2019-05-06: qty 2

## 2019-05-06 MED ORDER — ONDANSETRON HCL 4 MG PO TABS
4.0000 mg | ORAL_TABLET | Freq: Three times a day (TID) | ORAL | 0 refills | Status: AC | PRN
Start: 2019-05-06 — End: ?

## 2019-05-06 MED ORDER — ONDANSETRON HCL 4 MG/2ML IJ SOLN: 4.0000 mg | Freq: Once | INTRAMUSCULAR | Status: DC

## 2019-05-06 MED ORDER — CEFTRIAXONE SODIUM 250 MG IJ SOLR
250.0000 mg | Freq: Once | INTRAMUSCULAR | Status: AC
  Administered 2019-05-06: 250 mg via INTRAMUSCULAR
  Filled 2019-05-06: qty 250

## 2019-05-06 MED ORDER — DOXYCYCLINE HYCLATE 100 MG PO TABS
100.0000 mg | ORAL_TABLET | Freq: Once | ORAL | Status: AC
  Administered 2019-05-06: 100 mg via ORAL
  Filled 2019-05-06: qty 1

## 2019-05-06 MED ORDER — SODIUM CHLORIDE (PF) 0.9 % IJ SOLN
INTRAMUSCULAR | Status: AC
  Filled 2019-05-06: qty 50

## 2019-05-06 MED ORDER — AZITHROMYCIN 250 MG PO TABS
1000.0000 mg | ORAL_TABLET | Freq: Once | ORAL | Status: AC
  Administered 2019-05-06: 1000 mg via ORAL
  Filled 2019-05-06: qty 4

## 2019-05-06 MED ORDER — STERILE WATER FOR INJECTION IJ SOLN
INTRAMUSCULAR | Status: AC
  Filled 2019-05-06: qty 10

## 2019-05-06 MED ORDER — SODIUM CHLORIDE 0.9% FLUSH: 3.0000 mL | Freq: Once | INTRAVENOUS | Status: DC

## 2019-05-06 NOTE — ED Notes (Signed)
Pt transported to CT ?

## 2019-05-06 NOTE — ED Triage Notes (Signed)
Patient woke up with abdominal pain and throwing up. Patient states she is vomiting and can not stop.

## 2019-05-06 NOTE — Discharge Instructions (Signed)
As we discussed, you likely have ruptured an ovarian cyst.  Take the pain medication and antibiotics as prescribed.  Follow-up with your primary doctor or gynecologist.  Return to the ED with worsening pain, fever, chills, nausea or vomiting or concerns.

## 2019-05-06 NOTE — ED Provider Notes (Signed)
[facility]-EMERGENCY DEPT Provider Note   CSN: [id number] Arrival date & time: 05/06/19  0101     History Chief Complaint  Patient presents with  . Abdominal Pain    Hannah Drake is a 19 y.o. female.  Patient with severe lower abdominal pain for the past day with nausea and vomiting.  States she is unable to keep anything down.  The pain feels similar to her previous menstrual cycles but is never been this severe.  She describes pain all across her lower abdomen with multiple episodes of nausea and vomiting and diarrhea.  She has some vaginal bleeding which she states is her normal menstrual cycle.  Denies any discharge.  No pain with urination or blood in the urine.  No fever.  No back pain.  No previous abdominal surgeries.  She has had PID in the past.  Denies any possibility of pregnancy currently. Still has appendix and gallbladder.  Denies any drug use  The history is provided by the patient.  Abdominal Pain Associated symptoms: diarrhea, nausea, vaginal bleeding and vomiting   Associated symptoms: no chest pain, no cough, no dysuria, no fever, no hematuria, no shortness of breath and no vaginal discharge        Past Medical History:  Diagnosis Date  . Asthma   . Pelvic inflammatory disease (PID) 01/0/2021  . Seizures (HCC)    last seizure at age 36    There are no problems to display for this patient.   Past Surgical History:  Procedure Laterality Date  . MYRINGOTOMY    . ROOT CANAL    . TEAR DUCT PROBING    . TONSILLECTOMY       OB History    Gravida  0   Para  0   Term  0   Preterm  0   AB  0   Living  0     SAB  0   TAB  0   Ectopic  0   Multiple  0   Live Births  0           Family History  Problem Relation Age of Onset  . Cancer Mother   . Graves' disease Mother   . Seizures Mother   . Asthma Mother     Social History   Tobacco Use  . Smoking status: Former Games developer  . Smokeless tobacco: Never  Used  Substance Use Topics  . Alcohol use: No  . Drug use: No    Home Medications Prior to Admission medications   Medication Sig Start Date End Date Taking? Authorizing Provider  Acetaminophen (MIDOL PO) Take 1 tablet by mouth daily as needed (pain).   Yes [provider]  dicyclomine (BENTYL) 10 MG capsule Take 2 capsules (20 mg total) by mouth 4 (four) times daily as needed for spasms. 04/04/19  Yes Pisciotta, Joni Reining, PA-C  promethazine (PHENERGAN) 25 MG tablet Take 1 tablet (25 mg total) by mouth every 6 (six) hours as needed for nausea or vomiting. 04/04/19  Yes Pisciotta, Joni Reining, PA-C    Allergies    Patient has no known allergies.  Review of Systems   Review of Systems  Constitutional: Negative for activity change, appetite change and fever.  HENT: Negative for congestion and rhinorrhea.   Eyes: Negative for visual disturbance.  Respiratory: Negative for cough, chest tightness and shortness of breath.   Cardiovascular: Negative for chest pain.  Gastrointestinal: Positive for abdominal pain, diarrhea, nausea and vomiting.  Genitourinary: Positive for vaginal bleeding. Negative for dysuria, hematuria and vaginal discharge.  Musculoskeletal: Negative for arthralgias, back pain and myalgias.  Skin: Negative for rash.  Neurological: Negative for dizziness, light-headedness and headaches.   all other systems are negative except as noted in the HPI and PMH.    Physical Exam Updated Vital Signs BP (!) 134/91 (BP Location: Left Arm)   Pulse (!) 52   Temp 99 F (37.2 C) (Oral)   Resp 20   Ht 5\' 2"  (1.575 m)   Wt 55.8 kg   LMP 04/08/2019   SpO2 100%   BMI 22.50 kg/m   Physical Exam Vitals and nursing note reviewed.  Constitutional:      General: She is not in acute distress.    Appearance: She is well-developed.     Comments: Uncomfortable, leaning forward in the bed  HENT:     Head: Normocephalic and atraumatic.     Mouth/Throat:     Pharynx: No oropharyngeal  exudate.  Eyes:     Conjunctiva/sclera: Conjunctivae normal.     Pupils: Pupils are equal, round, and reactive to light.  Neck:     Comments: No meningismus. Cardiovascular:     Rate and Rhythm: Normal rate and regular rhythm.     Heart sounds: Normal heart sounds. No murmur.  Pulmonary:     Effort: Pulmonary effort is normal. No respiratory distress.     Breath sounds: Normal breath sounds.  Abdominal:     Palpations: Abdomen is soft.     Tenderness: There is abdominal tenderness. There is no guarding or rebound.     Comments: Diffuse lower abdominal tenderness with voluntary guarding  Genitourinary:    Comments: Chaperone present.  Normal sternal genitalia.  Scant white discharge in vaginal vault.  Positive CMT and bilateral ductal tenderness no midline tenderness. Musculoskeletal:        General: No tenderness. Normal range of motion.     Cervical back: Normal range of motion and neck supple.     Comments: No CVAT  Skin:    General: Skin is warm.  Neurological:     Mental Status: She is alert and oriented to person, place, and time.     Cranial Nerves: No cranial nerve deficit.     Motor: No abnormal muscle tone.     Coordination: Coordination normal.     Comments:  5/5 strength throughout. CN 2-12 intact.Equal grip strength.   Psychiatric:        Behavior: Behavior normal.     ED Results / Procedures / Treatments   Labs (all labs ordered are listed, but only abnormal results are displayed) Labs Reviewed  WET PREP, GENITAL - Abnormal; Notable for the following components:      Result Value   WBC, Wet Prep HPF POC FEW (*)    All other components within normal limits  COMPREHENSIVE METABOLIC PANEL - Abnormal; Notable for the following components:   Glucose, Bld 101 (*)    Calcium 8.8 (*)    AST <5 (*)    All other components within normal limits  CBC - Abnormal; Notable for the following components:   WBC 10.9 (*)    All other components within normal limits    URINALYSIS, ROUTINE W REFLEX MICROSCOPIC - Abnormal; Notable for the following components:   Hgb urine dipstick MODERATE (*)    All other components within normal limits  RAPID URINE DRUG SCREEN, HOSP PERFORMED - Abnormal; Notable for the following components:  Tetrahydrocannabinol POSITIVE (*)    All other components within normal limits  LIPASE, BLOOD  I-STAT BETA HCG BLOOD, ED ([facility], [facility], [facility] ONLY)  GC/CHLAMYDIA PROBE AMP ([place]) NOT AT [facility]    EKG None  Radiology CT ABDOMEN PELVIS W CONTRAST  Result Date: 05/06/2019 CLINICAL DATA:  19 year old female with pelvic pain. EXAM: CT ABDOMEN AND PELVIS WITH CONTRAST TECHNIQUE: Multidetector CT imaging of the abdomen and pelvis was performed using the standard protocol following bolus administration of intravenous contrast. CONTRAST:  OMNIPAQUE IOHEXOL 300 MG/ML  SOLN COMPARISON:  CT abdomen pelvis dated 04/04/2019. FINDINGS: Lower chest: The visualized lung bases are clear. No intra-abdominal free air. Small free fluid in the pelvis. Hepatobiliary: No focal liver abnormality is seen. No gallstones, gallbladder wall thickening, or biliary dilatation. Pancreas: Unremarkable. No pancreatic ductal dilatation or surrounding inflammatory changes. Spleen: Normal in size without focal abnormality. Adrenals/Urinary Tract: Adrenal glands are unremarkable. Kidneys are normal, without renal calculi, focal lesion, or hydronephrosis. Bladder is unremarkable. Stomach/Bowel: There is no bowel obstruction or active inflammation. The appendix is normal. Vascular/Lymphatic: The abdominal aorta and IVC are unremarkable. No portal venous gas. There is no adenopathy. Reproductive: The uterus and ovaries are grossly unremarkable. There is a 1 cm corpus luteum in the left ovary with possible rupture. Other: None Musculoskeletal: No acute or significant osseous findings. IMPRESSION: 1. A 1 cm left ovarian corpus luteum with possible rupture. 2. No bowel obstruction.  Normal appendix. Electronically Signed   By: Elgie Collard M.D.   On: 05/06/2019 03:51   US PELVIC COMPLETE W TRANSVAGINAL AND TORSION R/O  Result Date: 05/06/2019 CLINICAL DATA:  Right pelvic pain for 1 day EXAM: TRANSABDOMINAL AND TRANSVAGINAL ULTRASOUND OF PELVIS DOPPLER ULTRASOUND OF OVARIES TECHNIQUE: Both transabdominal and transvaginal ultrasound examinations of the pelvis were performed. Transabdominal technique was performed for global imaging of the pelvis including uterus, ovaries, adnexal regions, and pelvic cul-de-sac. It was necessary to proceed with endovaginal exam following the transabdominal exam to visualize the uterus, endometrium, and ovaries. Color and duplex Doppler ultrasound was utilized to evaluate blood flow to the ovaries. COMPARISON:  Pelvic ultrasound 09/04/2018, CT abdomen pelvis May 06, 2019 FINDINGS: Uterus Measurements: 8.4 x 3.5 x 4.8 cm = volume: 74 mL. Anteverted uterus. No concerning uterine abnormalities. Endometrium Thickness: 7.9 mm, non thickened.  No focal abnormality visualized. Right ovary Measurements: 3.6 x 1.9 x 1.9 cm = volume: 6.8 mL. Normal appearance of the right ovary. Normal anechoic follicles. Left ovary Measurements: 2.9 x 1.6 x 2.8 cm = volume: 6.7 mL. Crenulated cystic structure in the left ovary (image 52) could reflect a collapsing corpus luteum with some peripheral ring of fire vascularity on color Doppler. Pulsed Doppler evaluation of both ovaries demonstrates normal low-resistance arterial and venous waveforms. Other findings Small volume of pelvic free fluid. IMPRESSION: Small volume of pelvic free fluid with a crenulated, likely collapsing cyst in the left ovary may reflect a small cyst rupture. No acute right adnexal abnormality, specifically no evidence of torsion or worrisome adnexal lesion. Electronically Signed   By: Kreg Shropshire M.D.   On: 05/06/2019 04:20    Procedures Procedures (including critical care time)  Medications Ordered in  ED Medications  sodium chloride flush (NS) 0.9 % injection 3 mL (has no administration in time range)  ondansetron (ZOFRAN-ODT) disintegrating tablet 4 mg (has no administration in time range)  sodium chloride 0.9 % bolus 1,000 mL (has no administration in time range)  ondansetron (ZOFRAN) injection 4 mg (  has no administration in time range)  morphine 4 MG/ML injection 4 mg (has no administration in time range)  ondansetron (ZOFRAN) injection 4 mg (has no administration in time range)    ED Course  I have reviewed the triage vital signs and the nursing notes.  Pertinent labs & imaging results that were available during my care of the patient were reviewed by me and considered in my medical decision making (see chart for details).    MDM Rules/Calculators/A&P                      Lower abdominal pain with nausea and vomiting.  She is not pregnant.  Abdomen soft but tender diffusely in her lower abdomen.  UA does not show significant infection. Pelvic exam as above with diffuse tenderness.  Will treat empirically for PID.  Work-up shows no appendicitis.  Does show evidence of likely ruptured ovarian cyst on the left.  No evidence of ovarian torsion.  Patient with tenderness on pelvic exam we will treat empirically for PID Work-up shows ruptured ovarian cyst without evidence of ovarian torsion or no appendicitis.  Results discussed with patient and her significant other.  Advised safe sex practices and treatment of her sexual partners as well. Advised not using marijuana as it can objectively her abdominal pain.  Patient feels improved.  She is tolerating p.o. Patient to follow-up with her gynecologist. Advised to cease using marijuana. Return to the ED if worsening pain, fever, chills, nausea, any other concerns Final Clinical Impression(s) / ED Diagnoses Final diagnoses:  Pelvic pain    Rx / DC Orders ED Discharge Orders    None       Carolyna Yerian, Jeannett Senior, MD 05/06/19  [phone]

## 2019-05-06 NOTE — ED Notes (Signed)
Pt a/o and ambulatory at discharge. PT has family member at bedside to drive. Pt educated of medications instructions. Py verbalized understanding and denies questions.

## 2019-05-20 ENCOUNTER — Emergency Department (HOSPITAL_COMMUNITY)
Admission: EM | Admit: 2019-05-20 | Discharge: 2019-05-20 | Disposition: A | Discharge status: Home / Self Care | Payer: Self-pay | Attending: Emergency Medicine | Admitting: Emergency Medicine

## 2019-05-20 ENCOUNTER — Other Ambulatory Visit: Payer: Self-pay

## 2019-05-20 ENCOUNTER — Encounter (HOSPITAL_COMMUNITY): Payer: Self-pay | Admitting: Emergency Medicine

## 2019-05-20 DIAGNOSIS — B3731 Acute candidiasis of vulva and vagina: Secondary | ICD-10-CM

## 2019-05-20 DIAGNOSIS — B373 Candidiasis of vulva and vagina: Secondary | ICD-10-CM | POA: Insufficient documentation

## 2019-05-20 MED ORDER — FLUCONAZOLE 200 MG PO TABS
200.0000 mg | ORAL_TABLET | Freq: Once | ORAL | Status: AC
  Administered 2019-05-20: 200 mg via ORAL
  Filled 2019-05-20: qty 1

## 2019-05-20 MED ORDER — TERCONAZOLE 0.4 % VA CREA: 1.0000 | TOPICAL_CREAM | Freq: Every day | VAGINAL | 0 refills | Status: AC

## 2019-05-20 NOTE — ED Notes (Signed)
Awaiting meds from pharmacy  

## 2019-05-20 NOTE — ED Notes (Signed)
CBG was 93. 

## 2019-05-20 NOTE — Discharge Instructions (Signed)
Follow-up at the [facility] clinic next week.  Go ahead and start your yeast cream today

## 2019-05-20 NOTE — ED Provider Notes (Signed)
[facility] DEPT Provider Note   CSN: [id number] Arrival date & time: 05/20/19  1007     History Chief Complaint  Patient presents with  . Vaginal Discharge    Hannah Drake is a 19 y.o. female.  Patient complains of itching in her vagina and swelling and tenderness.  Is been going on for couple days  The history is provided by the patient.  Vaginal Discharge Quality:  Milky Severity:  Severe Onset quality:  Sudden Timing:  Constant Progression:  Waxing and waning Chronicity:  New Context: not after intercourse   Relieved by:  Nothing Associated symptoms: no abdominal pain        Past Medical History:  Diagnosis Date  . Asthma   . Pelvic inflammatory disease (PID) 01/0/2021  . 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   Past Surgical History:  Procedure Laterality Date  . MYRINGOTOMY    . ROOT CANAL    . TEAR DUCT PROBING    . TONSILLECTOMY       OB History    Gravida  0   Para  0   Term  0   Preterm  0   AB  0   Living  0     SAB  0   TAB  0   Ectopic  0   Multiple  0   Live Births  0           Family History  Problem Relation Age of Onset  . Cancer Mother   . Graves' disease Mother   . Seizures Mother   . Asthma Mother     Social History   Tobacco Use  . Smoking status: Former Research scientist (life sciences)  . Smokeless tobacco: Never Used  Substance Use Topics  . Alcohol use: No  . Drug use: No    Home Medications Prior to Admission medications   Medication Sig Start Date End Date Taking? Authorizing Provider  Acetaminophen (MIDOL PO) Take 1 tablet by mouth daily as needed (pain).   Yes [provider]  doxycycline (VIBRAMYCIN) 100 MG capsule Take 1 capsule (100 mg total) by mouth 2 (two) times daily. 05/06/19  Yes Rancour, Annie Main, MD  Homeopathic Products (AZO YEAST PLUS PO) Take 1 tablet by mouth in the morning and at bedtime.   Yes  [provider]  ibuprofen (ADVIL) 600 MG tablet Take 1 tablet (600 mg total) by mouth every 6 (six) hours as needed. 05/06/19  Yes Rancour, Annie Main, MD  Miconazole Nitrate Applicator (MONISTAT 7 COMBO PACK APP) 100 & 2 MG-% (9GM) KIT Place vaginally.   Yes [provider]  dicyclomine (BENTYL) 10 MG capsule Take 2 capsules (20 mg total) by mouth 4 (four) times daily as needed for spasms. Patient not taking: Reported on 05/20/2019 04/04/19   Pisciotta, Elmyra Ricks, PA-C  ondansetron (ZOFRAN) 4 MG tablet Take 1 tablet (4 mg total) by mouth every 8 (eight) hours as needed for nausea or vomiting. Patient not taking: Reported on 05/20/2019 05/06/19   Ezequiel Essex, MD  promethazine (PHENERGAN) 25 MG tablet Take 1 tablet (25 mg total) by mouth every 6 (six) hours as needed for nausea or vomiting. Patient not taking: Reported on 05/20/2019 04/04/19   Pisciotta, Elmyra Ricks, PA-C  terconazole (TERAZOL 7) 0.4 % vaginal cream Place 1 applicator vaginally at bedtime. 05/20/19   Milton Ferguson, MD    Allergies  Patient has no known allergies.  Review of Systems   Review of Systems  Constitutional: Negative for appetite change and fatigue.  HENT: Negative for congestion, ear discharge and sinus pressure.   Eyes: Negative for discharge.  Respiratory: Negative for cough.   Cardiovascular: Negative for chest pain.  Gastrointestinal: Negative for abdominal pain and diarrhea.  Genitourinary: Positive for vaginal discharge. Negative for frequency and hematuria.  Musculoskeletal: Negative for back pain.  Skin: Negative for rash.  Neurological: Negative for seizures and headaches.  Psychiatric/Behavioral: Negative for hallucinations.    Physical Exam Updated Vital Signs BP 116/82 (BP Location: Left Arm)   Pulse 83   Temp 98.1 F (36.7 C) (Oral)   Resp 18   LMP 04/29/2019   SpO2 100%   Physical Exam Vitals and nursing note reviewed.  Constitutional:      Appearance: She is well-developed.   HENT:     Head: Normocephalic.     Nose: Nose normal.  Eyes:     General: No scleral icterus.    Conjunctiva/sclera: Conjunctivae normal.  Neck:     Thyroid: No thyromegaly.  Cardiovascular:     Rate and Rhythm: Normal rate and regular rhythm.     Heart sounds: No murmur. No friction rub. No gallop.   Pulmonary:     Breath sounds: No stridor. No wheezing or rales.  Chest:     Chest wall: No tenderness.  Abdominal:     General: There is no distension.     Tenderness: There is no abdominal tenderness. There is no rebound.  Genitourinary:    Comments: Vulva swollen with white discharge and pruritic Musculoskeletal:        General: Normal range of motion.     Cervical back: Neck supple.  Lymphadenopathy:     Cervical: No cervical adenopathy.  Skin:    Findings: No erythema or rash.  Neurological:     Mental Status: She is alert and oriented to person, place, and time.     Motor: No abnormal muscle tone.     Coordination: Coordination normal.  Psychiatric:        Behavior: Behavior normal.     ED Results / Procedures / Treatments   Labs (all labs ordered are listed, but only abnormal results are displayed) Labs Reviewed  CBG MONITORING, ED    EKG None  Radiology No results found.  Procedures Procedures (including critical care time)  Medications Ordered in ED Medications  fluconazole (DIFLUCAN) tablet 200 mg (has no administration in time range)    ED Course  I have reviewed the triage vital signs and the nursing notes.  Pertinent labs & imaging results that were available during my care of the patient were reviewed by me and considered in my medical decision making (see chart for details).    MDM Rules/Calculators/A&P                      Patient with yeast vaginitis.  She is given Diflucan along with Terazol referred to GYN     This patient presents to the ED for concern of vaginal discharge, this involves an extensive number of treatment options,  and is a complaint that carries with it a high risk of complications and morbidity.  The differential diagnosis includes STD yeast   Lab Tests:   I Ordered, reviewed, and interpreted labs, which included CBG which was normal  Medicines ordered:   I ordered medication Diflucan for vaginitis  Imaging Studies ordered:   *  Additional history obtained:   Additional history obtained from records  Previous records obtained and reviewed  Consultations Obtained:   Reevaluation:  After the interventions stated above, I reevaluated the patient and found unchanged  Critical Interventions:  .   Final Clinical Impression(s) / ED Diagnoses Final diagnoses:  Yeast vaginitis    Rx / DC Orders ED Discharge Orders         Ordered    terconazole (TERAZOL 7) 0.4 % vaginal cream  Daily at bedtime     05/20/19 1110           Milton Ferguson, MD 05/20/19 1114

## 2019-05-20 NOTE — ED Triage Notes (Signed)
Pt reports that had vaginal irritation so took AZO yeast plus twice yesterday. And now having vaginal odor, discharge that is "weird" dark white-ish and thick.

## 2019-05-22 LAB — CBG MONITORING, ED: Glucose-Capillary: 93 mg/dL (ref 70–99)

## 2020-04-13 IMAGING — US ARTERIAL AND VENOUS ULTRASOUND OF THE ABDOMEN PELVIS AND SCROTUM
1 series · 13 of 25 positions shown · non-contrast
Comparison: Pelvic ultrasound 03/31/2018.

CLINICAL DATA: Pelvic pain for 3-4 days.

EXAM:
TRANSABDOMINAL AND TRANSVAGINAL ULTRASOUND OF PELVIS
DOPPLER ULTRASOUND OF OVARIES
TECHNIQUE: Both transabdominal and transvaginal ultrasound examinations of the
pelvis were performed. Transabdominal technique was performed for
global imaging of the pelvis including uterus, ovaries, adnexal
regions, and pelvic cul-de-sac.
It was necessary to proceed with endovaginal exam following the
transabdominal exam to visualize the ovaries. Color and duplex
Doppler ultrasound was utilized to evaluate blood flow to the
ovaries.

[Series 1: arterial and venous ultrasound of the abdomen pelv · 13 of 58 slices shown]
[im 1/58]
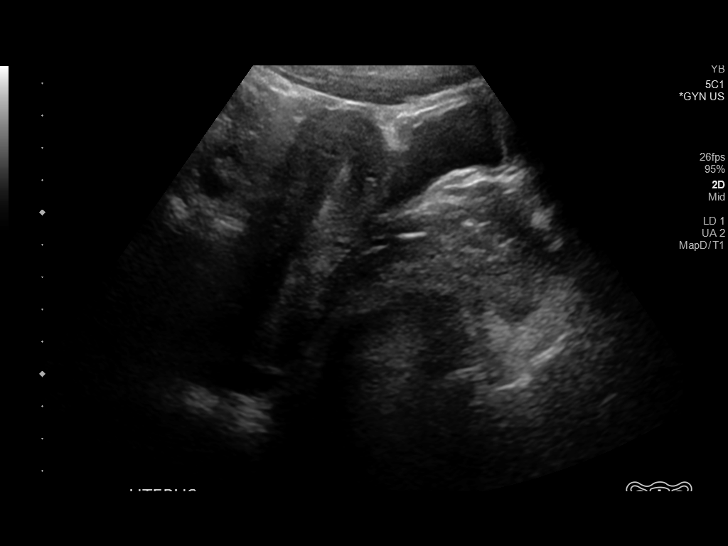
[im 5/58]
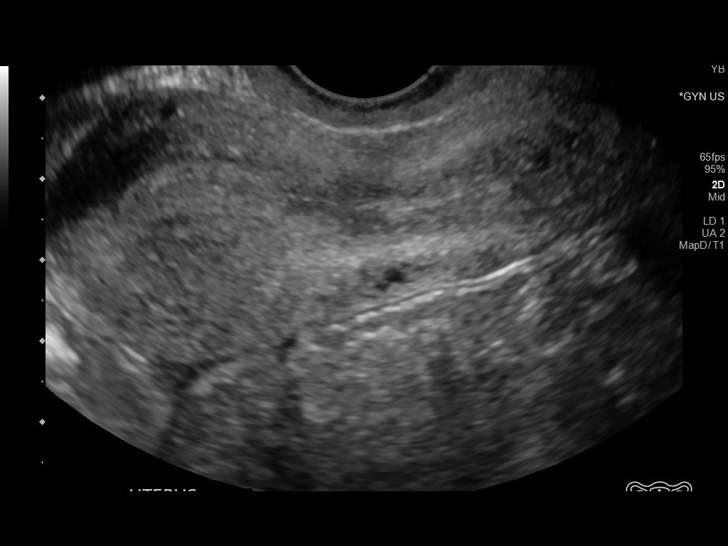
[im 10/58]
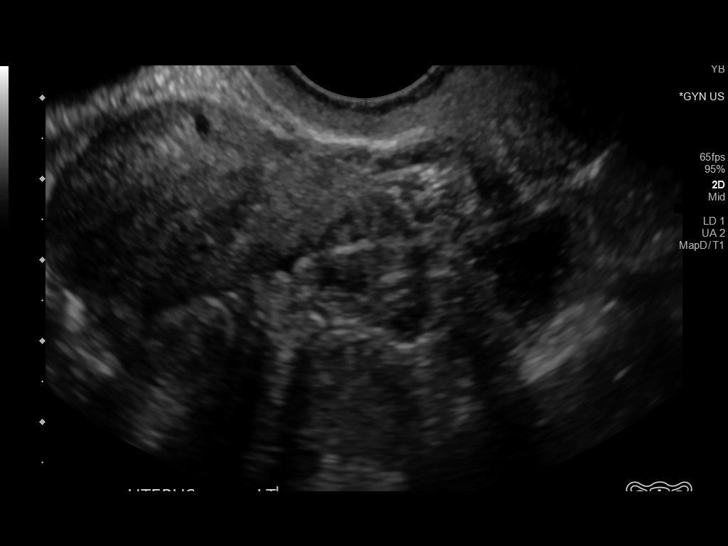
[im 15/58]
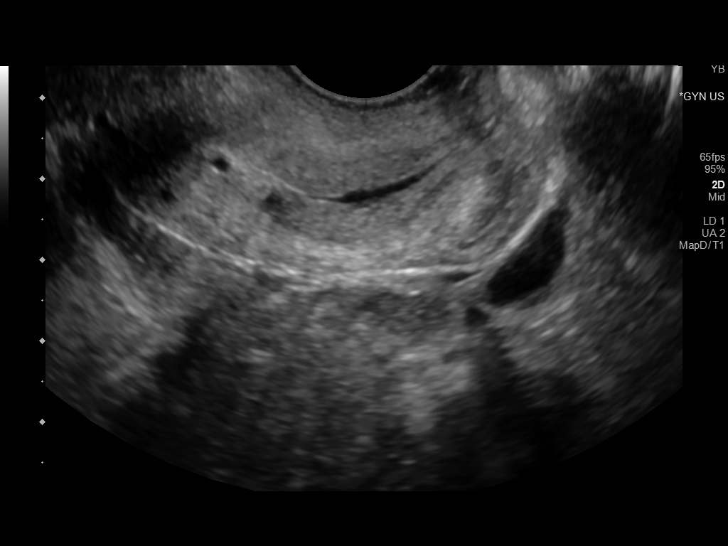
[im 20/58]
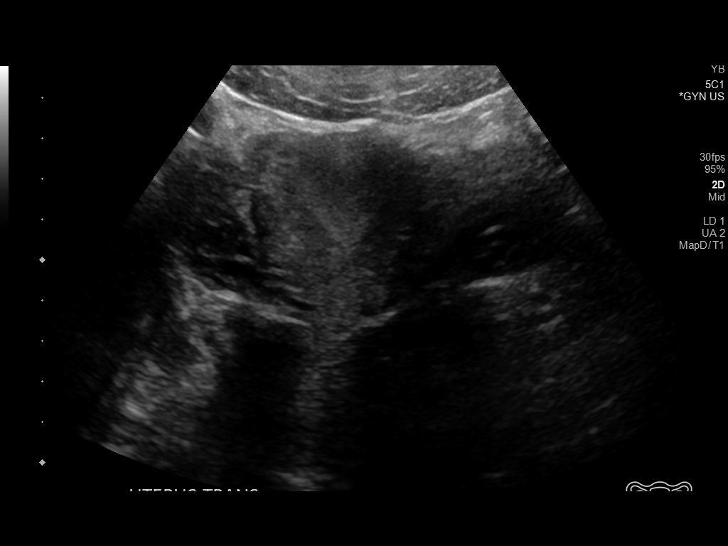
[im 24/58]
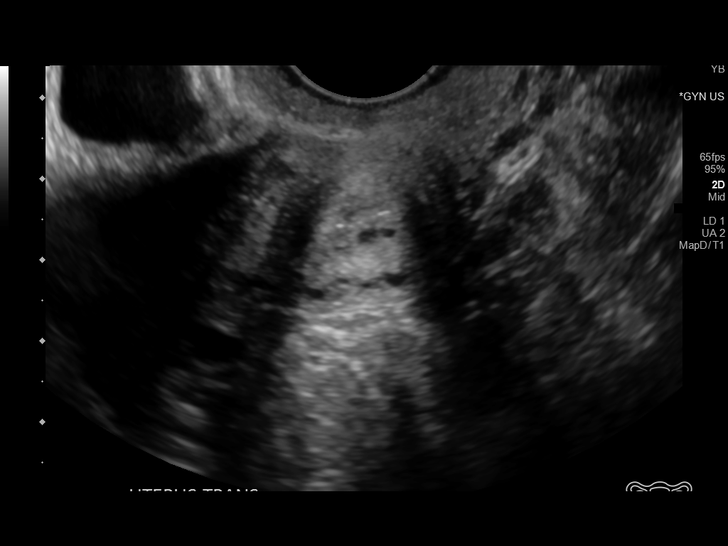
[im 29/58]
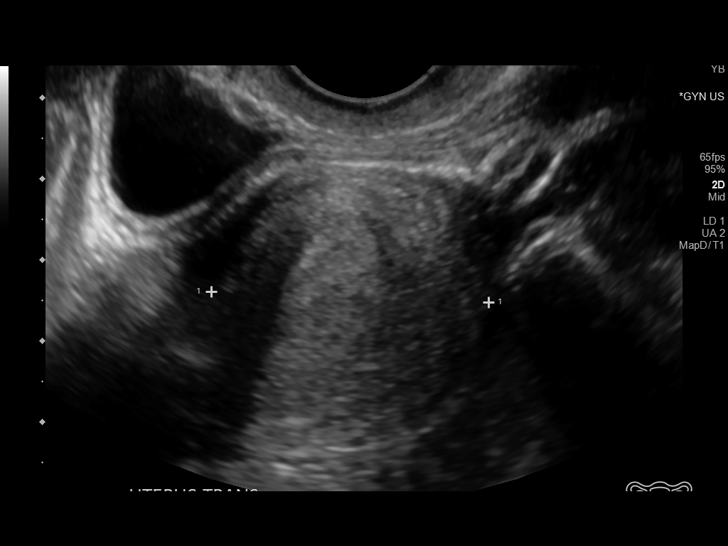
[im 34/58]
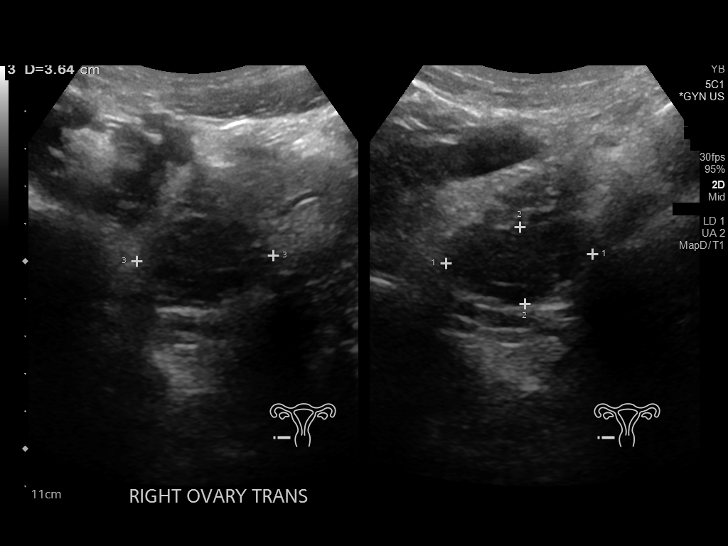
[im 39/58]
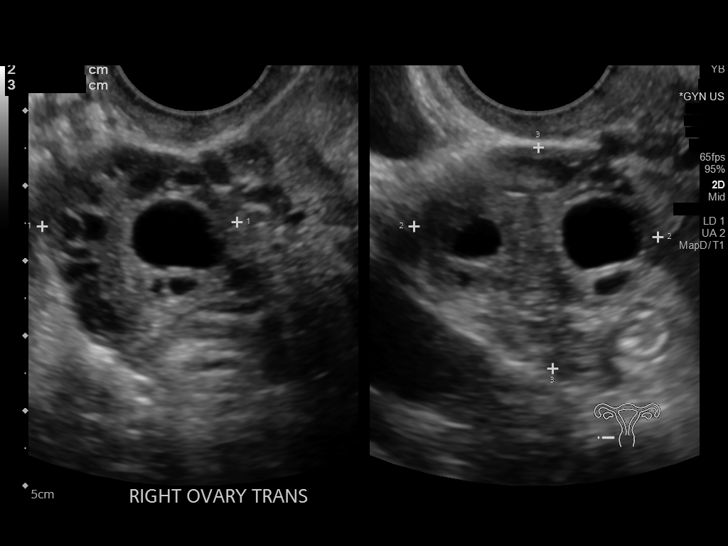
[im 43/58]
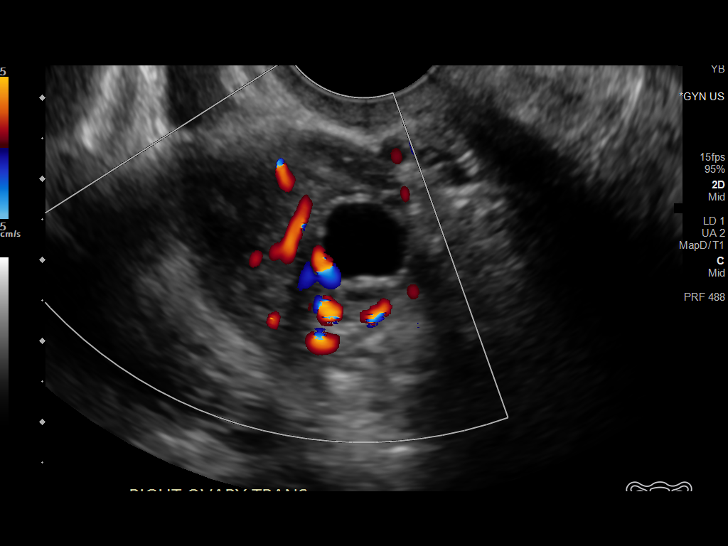
[im 48/58]
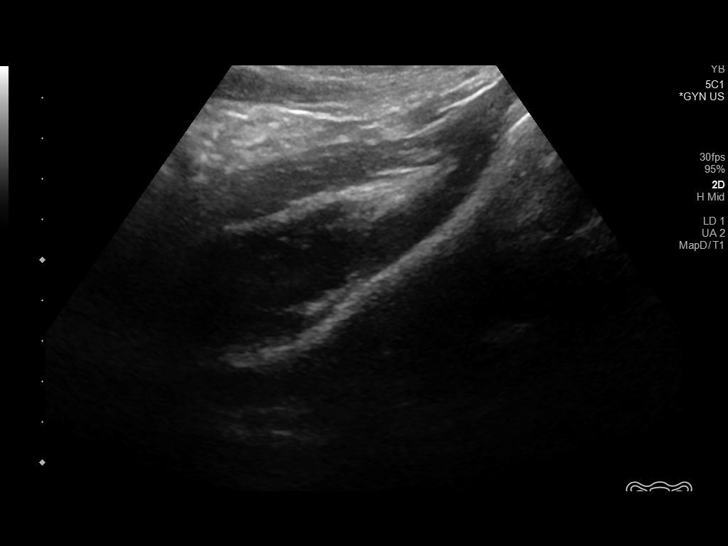
[im 53/58]
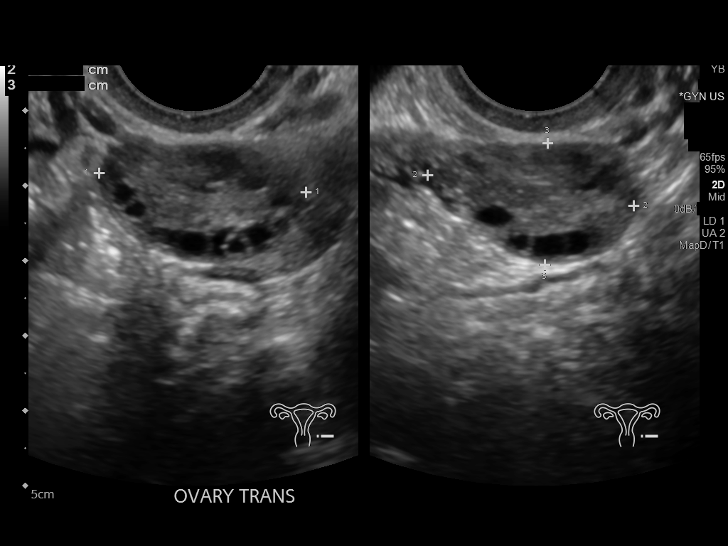
[im 58/58]
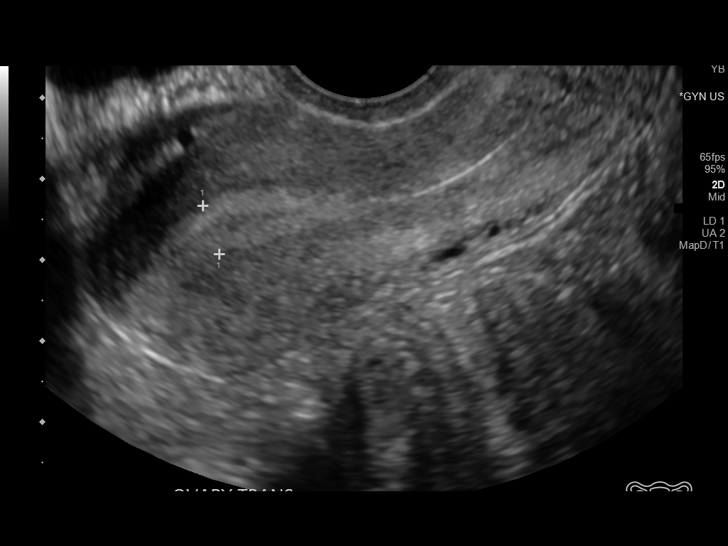

[13 of 25 positions shown; findings below may reference images not displayed]

FINDINGS: Uterus

Measurements: 7.9 x 3.4 x 3.4 cm = volume: 47.8 mL. No fibroids or
other mass visualized.

Endometrium

Thickness: 0.6.  No focal abnormality visualized.

Right ovary

Measurements: 3.3 x 3.0 x 2.6 cm = volume: 13.1 mL. Normal
appearance/no adnexal mass.

Left ovary

Measurements: 2.8 x 2.8 x 1.6 cm = volume: 6.5 mL. Normal
appearance/no adnexal mass.

Pulsed Doppler evaluation of both ovaries demonstrates normal
low-resistance arterial and venous waveforms.

Other findings

No abnormal free fluid.
IMPRESSION: Negative for ovarian torsion.  Negative exam.

## 2021-02-01 ENCOUNTER — Other Ambulatory Visit: Payer: Self-pay

## 2021-02-01 ENCOUNTER — Inpatient Hospital Stay (HOSPITAL_COMMUNITY)
Admission: AD | Admit: 2021-02-01 | Discharge: 2021-02-01 | Disposition: A | Discharge status: Home / Self Care | Payer: Commercial Managed Care - PPO | Attending: Obstetrics and Gynecology | Admitting: Obstetrics and Gynecology

## 2021-02-01 ENCOUNTER — Encounter (HOSPITAL_COMMUNITY): Payer: Self-pay | Admitting: Obstetrics and Gynecology

## 2021-02-01 DIAGNOSIS — Z3A39 39 weeks gestation of pregnancy: Secondary | ICD-10-CM | POA: Insufficient documentation

## 2021-02-01 DIAGNOSIS — B9689 Other specified bacterial agents as the cause of diseases classified elsewhere: Secondary | ICD-10-CM | POA: Diagnosis not present

## 2021-02-01 DIAGNOSIS — O23593 Infection of other part of genital tract in pregnancy, third trimester: Secondary | ICD-10-CM | POA: Insufficient documentation

## 2021-02-01 DIAGNOSIS — O471 False labor at or after 37 completed weeks of gestation: Secondary | ICD-10-CM | POA: Insufficient documentation

## 2021-02-01 DIAGNOSIS — Z3689 Encounter for other specified antenatal screening: Secondary | ICD-10-CM

## 2021-02-01 DIAGNOSIS — N760 Acute vaginitis: Secondary | ICD-10-CM | POA: Diagnosis not present

## 2021-02-01 DIAGNOSIS — O479 False labor, unspecified: Secondary | ICD-10-CM

## 2021-02-01 DIAGNOSIS — Z3493 Encounter for supervision of normal pregnancy, unspecified, third trimester: Secondary | ICD-10-CM

## 2021-02-01 LAB — WET PREP, GENITAL
Sperm: NONE SEEN
Trich, Wet Prep: NONE SEEN
WBC, Wet Prep HPF POC: <10 (ref <10)
Yeast Wet Prep HPF POC: NONE SEEN

## 2021-02-01 LAB — POCT FERN TEST: POCT Fern Test: NEGATIVE

## 2021-02-01 MED ORDER — METRONIDAZOLE 0.75 % VA GEL: 1.0000 | Freq: Every day | VAGINAL | 1 refills | Status: AC

## 2021-02-01 NOTE — MAU Note (Signed)
Pt reports ctx since earlier thsi morning. Now more frequent and more uncomfortable. C/o clear fluid leaking out . Good fetal movement felt. Not dilated on office visit last week. Gets care in [place].

## 2021-02-01 NOTE — MAU Provider Note (Signed)
Event Date/Time   First Provider Initiated Contact with Patient 02/01/21 2136      S: Ms. Hannah Drake is a 21 y.o. G1P0000 at [redacted]w[redacted]d  who presents to MAU today complaining of leaking clear fluid since 7:15pm. She is not wearing a pad, but reports she did have to change her underwear. She reports some ongoing vaginal itching, but denies odor. She reports irregular contractions. She denies vaginal bleeding. She reports normal fetal movement. Patient receives [facility] in [place], records reviewed.   O: BP 124/71    Pulse 84    Temp 98.4 F (36.9 C)    Resp 18    Ht 5\' 2"  (1.575 m)    Wt 72.6 kg    BMI 29.26 kg/m  GENERAL: Well-developed, well-nourished female in no acute distress.  HEAD: Normocephalic, atraumatic.  CHEST: Normal effort of breathing, regular heart rate ABDOMEN: Soft, nontender, gravid PELVIC: NEFG, vaginal walls pink with rugae, small amount of white discharge, no pooling of amniotic fluid or bleeding, cervix visually closed without lesions/masses Fern negative  Cervical exam:  Dilation: Closed Presentation: Vertex Exam by:: D.Saunders [place],CNM   Fetal Monitoring: Baseline: 125 bpm Variability: moderate Accelerations: 15x15 present Decelerations: absent Contractions: q5-23mins    A: SIUP at [redacted]w[redacted]d  False labor Bacterial vaginosis Intact membranes NST reactive  P: Discharge home in stable condition Rx for metrogel sent to pharmacy Labor precautions and kick counts reviewed Keep OB appointment as scheduled on Friday 2/3. Return to MAU sooner or as needed for worsening symptoms   Wednesday, CNM 02/01/2021 10:40 PM

## 2021-02-03 ENCOUNTER — Inpatient Hospital Stay (HOSPITAL_COMMUNITY)
Admission: AD | Admit: 2021-02-03 | Discharge: 2021-02-03 | Disposition: A | Discharge status: Home / Self Care | Payer: Commercial Managed Care - PPO | Attending: Obstetrics and Gynecology | Admitting: Obstetrics and Gynecology

## 2021-02-03 ENCOUNTER — Encounter (HOSPITAL_COMMUNITY): Payer: Self-pay | Admitting: Obstetrics and Gynecology

## 2021-02-03 ENCOUNTER — Other Ambulatory Visit: Payer: Self-pay

## 2021-02-03 DIAGNOSIS — Z3A39 39 weeks gestation of pregnancy: Secondary | ICD-10-CM | POA: Insufficient documentation

## 2021-02-03 DIAGNOSIS — O479 False labor, unspecified: Secondary | ICD-10-CM

## 2021-02-03 DIAGNOSIS — O471 False labor at or after 37 completed weeks of gestation: Secondary | ICD-10-CM | POA: Diagnosis present

## 2021-02-03 HISTORY — DX: Anemia, unspecified: D64.9

## 2021-02-03 NOTE — MAU Provider Note (Addendum)
Hannah Drake is a 21 y.o. G1P0000 female at [redacted]w[redacted]d.  RN labor check, not seen by provider.  SVE by RN: Dilation: Closed Exam by:: Holly Flippin RN  NST: FHR baseline 135 bpm, Variability: moderate, Accelerations: present, Decelerations:  Absent= Cat 1/Reactive Toco: irregular, every 5-9 minutes   Assessment and Plan: - SVE unchanged upon recheck by RN - Not in active labor - Cat 1/reactive NST - Stable for discharge with return precautions  Erick Alley, DO  02/03/2021 11:05 AM  GME ATTESTATION:  I agree with the findings and the plan of care as documented in the residents note. I have made changes to documentation as necessary. I have reviewed the NST, reactive tracing. No cervical change while in MAU. False labor, stable for discharge.  Evalina Field, MD OB Fellow, Faculty [facility], Center for [facility] 02/03/2021 2:35 PM

## 2021-02-03 NOTE — MAU Note (Signed)
...  Hannah Drake is a 21 y.o. at [redacted]w[redacted]d here in MAU reporting: CTX throughout the night. Unable to tell how far apart they are. She thinks maybe 7-8 minutes apart. Endorses losing her mucous plug around 0600 this morning and she states it had a bloody tinge. +FM. No LOF.  Gets her OB care in [place]. She states her OB dismissed her symptoms of vaginal itching so she is going to come here from now on. Next OB appointment this upcoming Friday. RN encouraged patient to keep this appointment if she is still pregnant.  Pain score: 5/10 lower abdomen  FHT: 150 initial external

## 2021-02-03 NOTE — MAU Note (Signed)
I have communicated with Dr. Yetta Barre and reviewed vital signs:  Vitals:   02/03/21 1013 02/03/21 1058  BP: 117/64 128/63  Pulse: 84 67  Resp: 18   Temp:  98.5 F (36.9 C)  SpO2: 99% 99%    Vaginal exam:  Dilation: Closed Exam by:: Holly Flippin RN,   Also reviewed contraction pattern and that non-stress test is reactive.  It has been documented that patient is contracting every 15-20 minutes and her cervical exam indicates she is closed. Patient denies any other complaints.  Based on this report provider has given order for discharge.  A discharge order and diagnosis entered by a provider.   Labor discharge instructions reviewed with patient.

## 2021-04-05 IMAGING — CT CT ABD-PELV W/ CM
2 of 4 series · 16 of 46 positions shown, 18 images · IV contrast (omnipaque)
Comparison: CT abdomen pelvis dated 04/04/2019.

CLINICAL DATA: 18-year-old female with pelvic pain.

EXAM:
CT ABDOMEN AND PELVIS WITH CONTRAST
TECHNIQUE: Multidetector CT imaging of the abdomen and pelvis was performed
using the standard protocol following bolus administration of
intravenous contrast.
CONTRAST:  100mL OMNIPAQUE IOHEXOL 300 MG/ML  SOLN

[Series 2: axial st · axial · 0.60mm/px · z∈[-465,-85]mm · 13 of 86 slices shown, 15 images]
[im 5/86  soft-tissue]
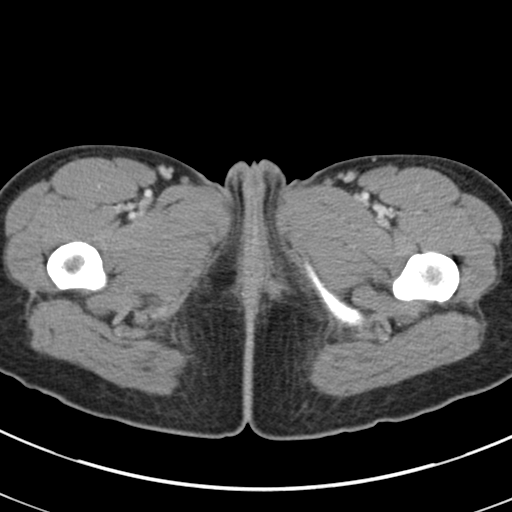
[im 5/86  bone]
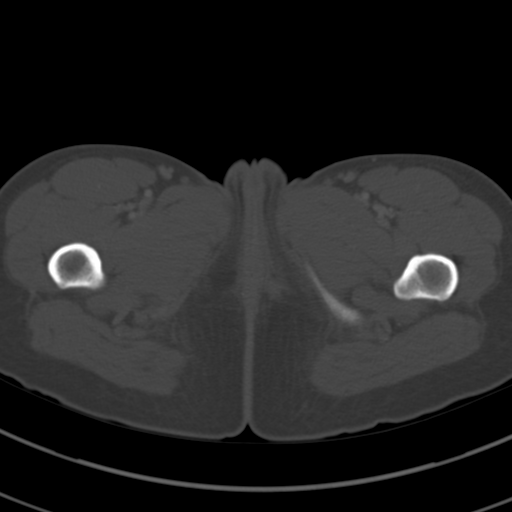
[im 13/86  soft-tissue]
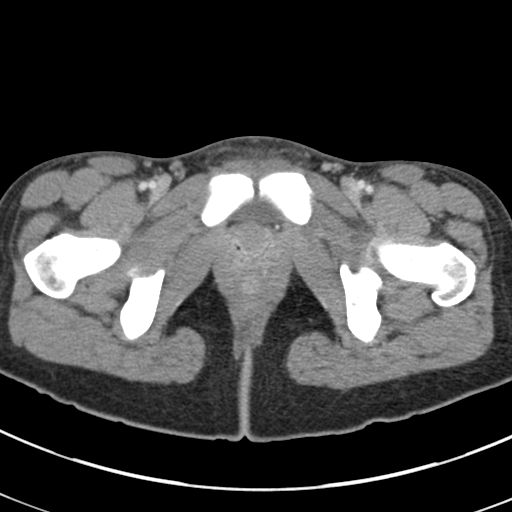
[im 18/86  soft-tissue]
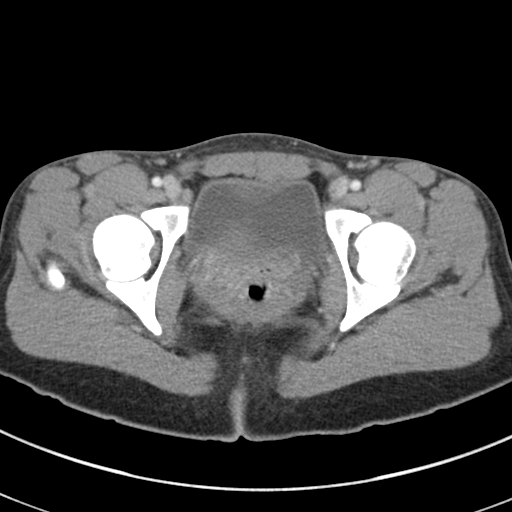
[im 26/86  soft-tissue]
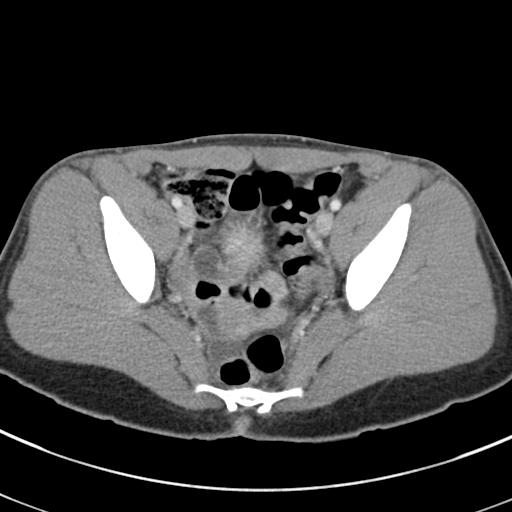
[im 30/86  soft-tissue]
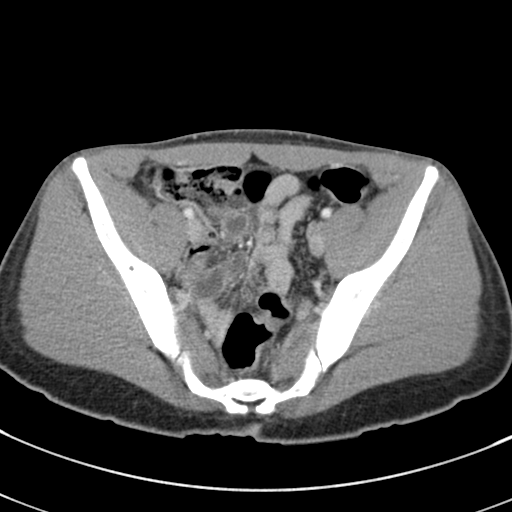
[im 39/86  soft-tissue]
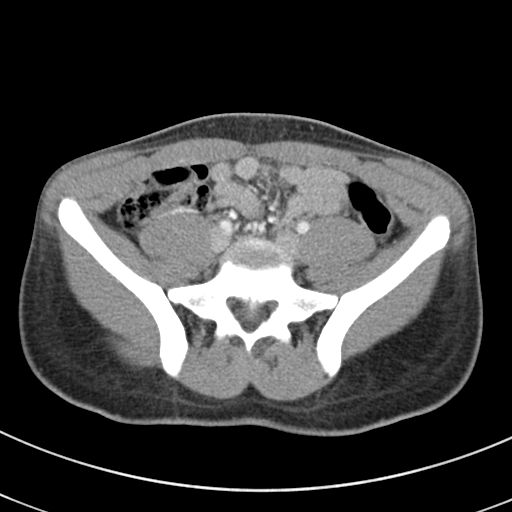
[im 43/86  soft-tissue]
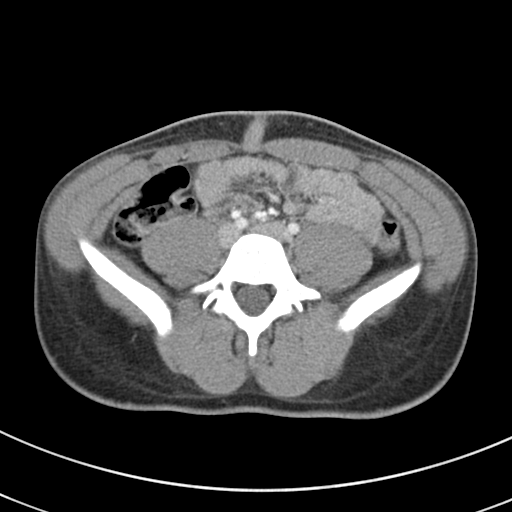
[im 47/86  soft-tissue]
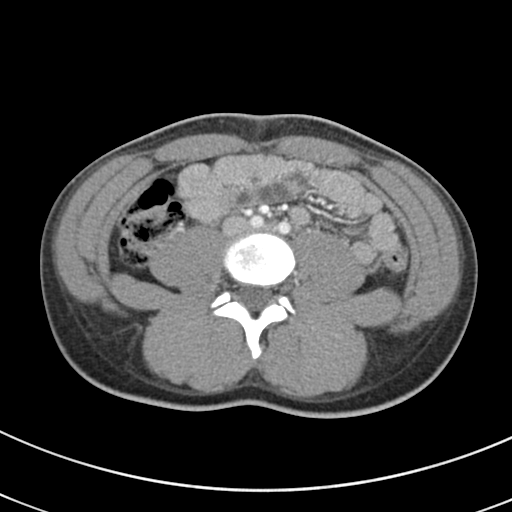
[im 56/86  soft-tissue]
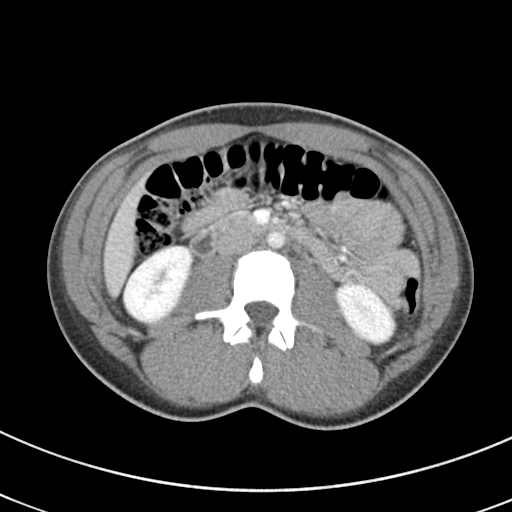
[im 56/86  bone]
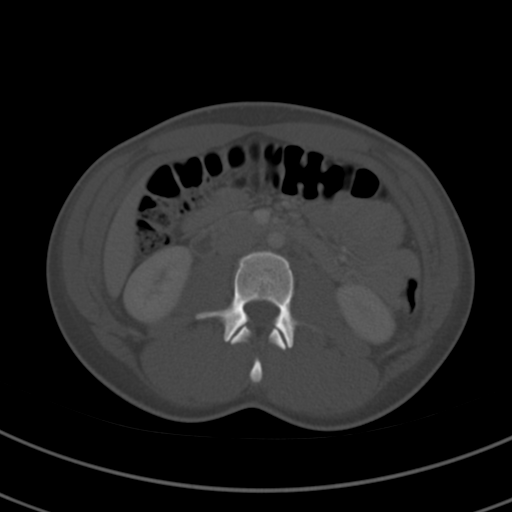
[im 60/86  soft-tissue]
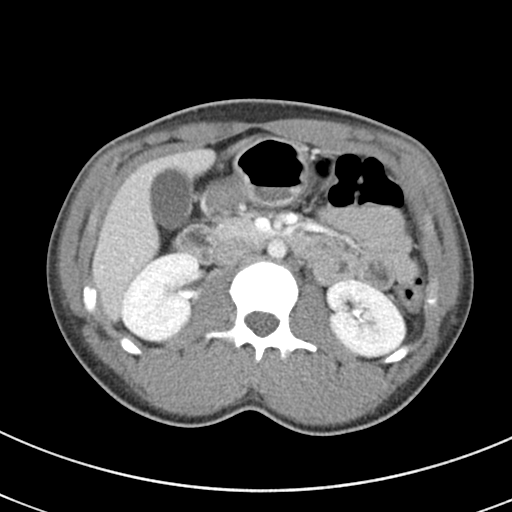
[im 69/86  soft-tissue]
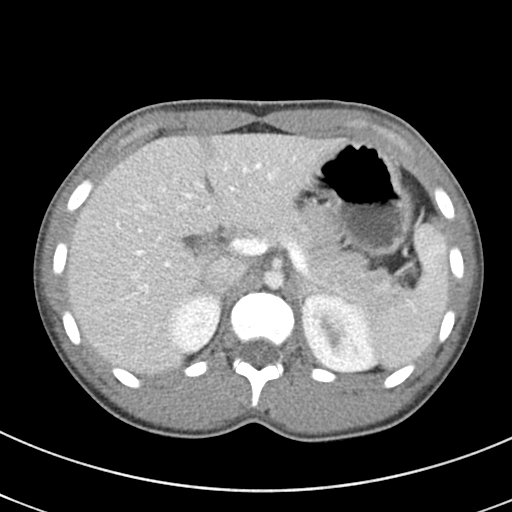
[im 73/86  soft-tissue]
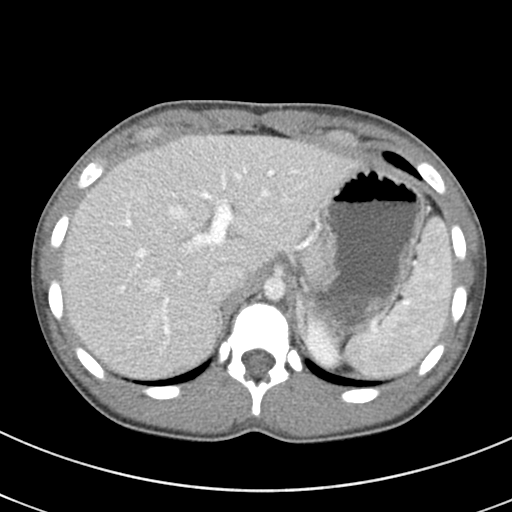
[im 81/86  soft-tissue]
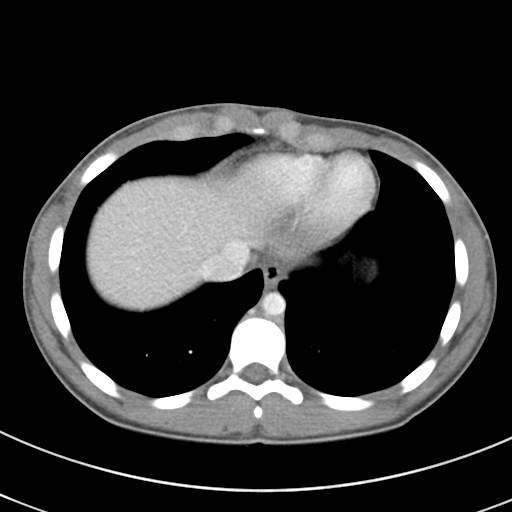

[Series 5: coronal st · coronal · 0.62mm/px · 3 of 112 slices shown]
[im 38/112  soft-tissue]
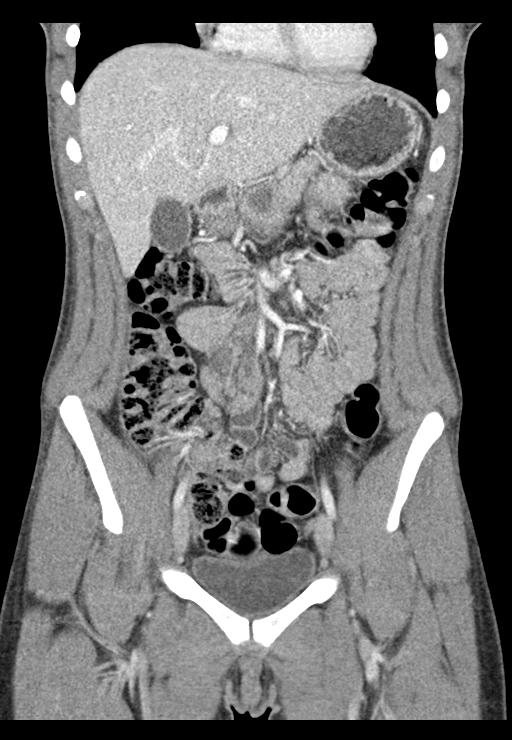
[im 50/112  soft-tissue]
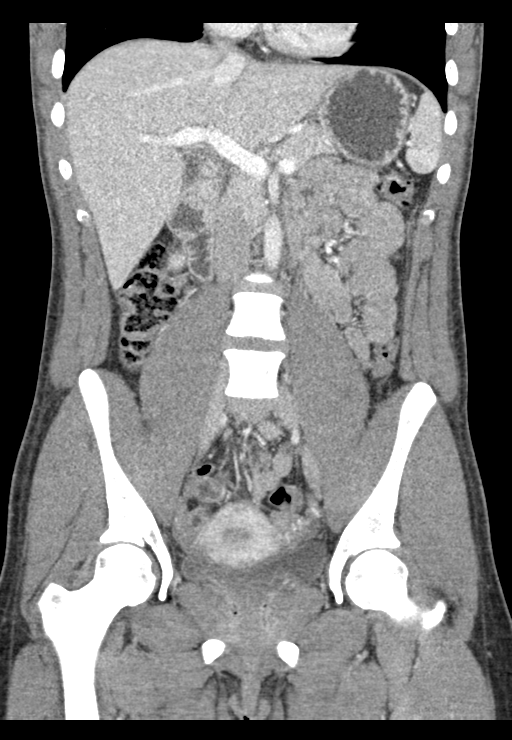
[im 62/112  soft-tissue]
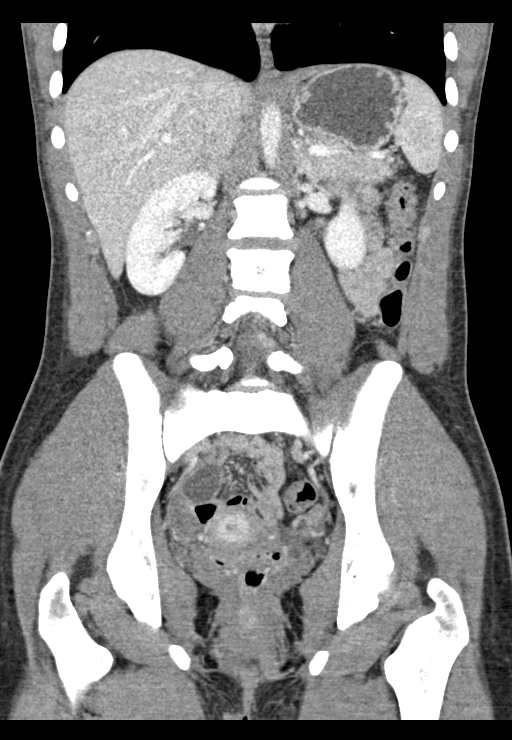

[16 of 46 positions shown; findings below may reference images not displayed]

FINDINGS: Lower chest: The visualized lung bases are clear.

No intra-abdominal free air. Small free fluid in the pelvis.

Hepatobiliary: No focal liver abnormality is seen. No gallstones,
gallbladder wall thickening, or biliary dilatation.

Pancreas: Unremarkable. No pancreatic ductal dilatation or
surrounding inflammatory changes.

Spleen: Normal in size without focal abnormality.

Adrenals/Urinary Tract: Adrenal glands are unremarkable. Kidneys are
normal, without renal calculi, focal lesion, or hydronephrosis.
Bladder is unremarkable.

Stomach/Bowel: There is no bowel obstruction or active inflammation.
The appendix is normal.

Vascular/Lymphatic: The abdominal aorta and IVC are unremarkable. No
portal venous gas. There is no adenopathy.

Reproductive: The uterus and ovaries are grossly unremarkable. There
is a 1 cm corpus luteum in the left ovary with possible rupture.

Other: None

Musculoskeletal: No acute or significant osseous findings.
IMPRESSION: 1. A 1 cm left ovarian corpus luteum with possible rupture.
2. No bowel obstruction. Normal appendix.
